# Patient Record
Sex: Female | Born: 1975 | Race: Black or African American | Hispanic: No | Smoking: Never smoker
Health system: Southern US, Community
[De-identification: ages and names within clinical notes are randomized; demographics above are authoritative.]

## PROBLEM LIST (undated history)

## (undated) ENCOUNTER — Inpatient Hospital Stay: Payer: Self-pay

## (undated) ENCOUNTER — Emergency Department: Admission: EM | Disposition: A | Discharge status: Still In Hospital | Payer: Medicare Other

## (undated) DIAGNOSIS — F329 Major depressive disorder, single episode, unspecified: Secondary | ICD-10-CM

## (undated) DIAGNOSIS — F32A Depression, unspecified: Secondary | ICD-10-CM

## (undated) DIAGNOSIS — K859 Acute pancreatitis without necrosis or infection, unspecified: Secondary | ICD-10-CM

## (undated) DIAGNOSIS — I10 Essential (primary) hypertension: Secondary | ICD-10-CM

## (undated) DIAGNOSIS — D649 Anemia, unspecified: Secondary | ICD-10-CM

## (undated) HISTORY — PX: CHOLECYSTECTOMY: SHX55

## (undated) NOTE — ED Notes (Signed)
 Formatting of this note might be different from the original. Awake and alert, oriented x4, reports needing refill of medication, no other c/o at this time, waiting registration for d/c Electronically signed by Brox, Kristi B, RN at 02/12/2021 12:05 AM CST

## (undated) NOTE — ED Provider Notes (Signed)
 Formatting of this note is different from the original.   Arch Pepper [phone]  [facility] EMERGENCY DEPARTMENT - [place]  [facility]  History   Chief Complaint  Patient presents with  ? Medication Request    Pt reuqesting metformin and albulterol inhaler; denies any physical c/o      This patient is a 17 year old  Female who presents to the ED for evaluation of needing a medication refill.  Patient states she needs her diabetic medications and an albuterol inhaler refilled.  Patient states she is currently traveling with her husband to [facility].  Patient's symptoms are constant, 0/10 severity, denies modifying factors, radiation of symptoms, associated symptoms other than as above.  No past medical history on file.  No past surgical history on file.  No family history on file.  Social History   Socioeconomic History  ? Marital status: Single    Spouse name: Not on file  ? Number of children: Not on file  ? Years of education: Not on file  ? Highest education level: Not on file  Occupational History  ? Not on file  Tobacco Use  ? Smoking status: Not on file  ? Smokeless tobacco: Not on file  Substance and Sexual Activity  ? Alcohol use: Not on file  ? Drug use: Not on file  ? Sexual activity: Not on file  Other Topics Concern  ? Not on file  Social History Narrative  ? Not on file   Social Determinants of Health   Financial Resource Strain: Not on file  Food Insecurity: Not on file  Transportation Needs: Not on file  Physical Activity: Not on file  Stress: Not on file  Social Connections: Not on file  Intimate Partner Violence: Not on file  Housing Stability: Not on file   Review of Systems   Review of Systems  Constitutional: Negative for chills and fever.  Eyes: Negative for blurred vision.  Respiratory: Negative for shortness of breath.   Cardiovascular: Negative for chest pain.  Gastrointestinal: Negative for abdominal pain.   Neurological: Negative for dizziness and headaches.   Physical Exam   BP 164/90   Pulse 81   Temp 97.8 F (36.6 C) (Oral)   Resp 16   Ht 1.778 m (5' 10)   Wt 108.9 kg (240 lb)   SpO2 100%   BMI 34.44 kg/m   Physical Exam Vitals and nursing note reviewed.  HENT:     Head: Normocephalic and atraumatic.     Nose: Nose normal.  Eyes:     General: No scleral icterus.    Conjunctiva/sclera: Conjunctivae normal.  Neck:     Trachea: No tracheal deviation.  Cardiovascular:     Rate and Rhythm: Normal rate and regular rhythm.  Pulmonary:     Effort: Pulmonary effort is normal.     Breath sounds: Normal breath sounds. No stridor.  Abdominal:     General: Bowel sounds are normal.     Palpations: Abdomen is soft.     Tenderness: There is no abdominal tenderness.  Musculoskeletal:        General: No deformity. Normal range of motion.     Cervical back: Normal range of motion.  Skin:    General: Skin is warm and dry.  Neurological:     Mental Status: She is alert.  Psychiatric:        Behavior: Behavior normal.   Medications Current Outpatient Medications  Medication Sig Dispense Refill  ? albuterol HFA (  PROAIR HFA) 108 (90 Base) MCG/ACT inhaler Inhale 2 (two) puffs by mouth every 6 hours as needed for Shortness of Breath or Wheezing 8.5 g 0  ? metFORMIN (GLUCOPHAGE) 500 MG tablet Take 2 (two) tablets by mouth 2 times daily with morning and evening meal for 30 days 120 tablet 0   Procedures  Procedures  Lab Interpretation      Oxygen Saturation Interpretation The oxygen saturation level is: 100%. The patient was on Room Air for the saturation measurement. Measurement frequency: Spot Check. Oxygen saturation interpretation is Normal. Intervention(s) used: None.  No results found for this visit on 02/11/21.  No orders to display   Progress Notes   ED Course  ED Course as of 02/12/21 0017  Wed Feb 12, 2021  0015 Pt S/E, HD stable, nontoxic, has normal exam and  requesting meds refilled. DC in stable condition with printed RX [MW]   ED Course User Index [MW] Debarah Donnice SAUNDERS, DO    Clinical Impressions as of 02/12/21 0017  Encounter for medication refill     Medical Decision Making I have reviewed the: Nursing Notes, Vitals. I have interpreted the following results: Oxygen Saturation.      Orders Placed This Encounter  ? metFORMIN (GLUCOPHAGE) 500 MG tablet  ? albuterol HFA (PROAIR HFA) 108 (90 Base) MCG/ACT inhaler    Follow-up Information    [facility] Emergency Department - [place]  [place].   Specialty: Emergency Room Why: As needed Contact information: [address] [place]  [place]  [phone]        Electronically signed by Debarah Donnice SAUNDERS, DO at 02/12/2021 12:17 AM CST

## (undated) NOTE — ED Notes (Signed)
 Formatting of this note might be different from the original. Provider at bs for exam Electronically signed by Bernetta Rayfield NOVAK, RN at 02/12/2021 12:04 AM CST

---

## 2015-11-29 ENCOUNTER — Encounter: Payer: Self-pay | Admitting: Emergency Medicine

## 2015-11-29 ENCOUNTER — Emergency Department
Admission: EM | Admit: 2015-11-29 | Discharge: 2015-11-29 | Disposition: A | Discharge status: Home / Self Care | Payer: Medicare Other | Attending: Emergency Medicine | Admitting: Emergency Medicine

## 2015-11-29 DIAGNOSIS — M62830 Muscle spasm of back: Secondary | ICD-10-CM | POA: Insufficient documentation

## 2015-11-29 DIAGNOSIS — I10 Essential (primary) hypertension: Secondary | ICD-10-CM | POA: Diagnosis not present

## 2015-11-29 DIAGNOSIS — M545 Low back pain: Secondary | ICD-10-CM | POA: Diagnosis present

## 2015-11-29 HISTORY — DX: Essential (primary) hypertension: I10

## 2015-11-29 HISTORY — DX: Acute pancreatitis without necrosis or infection, unspecified: K85.90

## 2015-11-29 MED ORDER — MELOXICAM 15 MG PO TABS: 15.0000 mg | ORAL_TABLET | Freq: Every day | ORAL | Status: DC

## 2015-11-29 MED ORDER — CYCLOBENZAPRINE HCL 10 MG PO TABS: 10.0000 mg | ORAL_TABLET | Freq: Three times a day (TID) | ORAL | Status: DC | PRN

## 2015-11-29 NOTE — ED Provider Notes (Signed)
[facility] Emergency Department Provider Note  ____________________________________________  Time seen: Approximately 4:58 PM  I have reviewed the triage vital signs and the nursing notes.   HISTORY  Chief Complaint Back Pain    HPI Jennifer Barton is a 39 y.o. female who presents emergency department complaining of bilateral lower back pain. Patient states that symptoms began this morning and have increased over the day. She denies any previous injury to area. She denies any numbness or tingling in lower extremity's. She states that the pain does increase with a "really deep breath." She denies any upper respiratory complaints to include his congestion, sore throat, cough, was. She denies any urinary complaints of dysuria, polyuria, hematuria. Pain is constant, worse with movement or deep breathing.   Past Medical History  Diagnosis Date  . Hypertension   . Pancreatitis     There are no active problems to display for this patient.   Past Surgical History  Procedure Laterality Date  . Cholecystectomy      Current Outpatient Rx  Name  Route  Sig  Dispense  Refill  . cyclobenzaprine (FLEXERIL) 10 MG tablet   Oral   Take 1 tablet (10 mg total) by mouth 3 (three) times daily as needed for muscle spasms.   15 tablet   0   . meloxicam (MOBIC) 15 MG tablet   Oral   Take 1 tablet (15 mg total) by mouth daily.   30 tablet   0     Allergies Review of patient's allergies indicates no known allergies.  No family history on file.  Social History Social History  Substance Use Topics  . Smoking status: Never Smoker   . Smokeless tobacco: None  . Alcohol Use: Yes     Comment: occasionally    Review of Systems Constitutional: No fever/chills Eyes: No visual changes. ENT: No sore throat. Cardiovascular: Denies chest pain. Respiratory: Denies shortness of breath. Gastrointestinal: No abdominal pain.  No nausea, no vomiting.  No diarrhea.  No  constipation. Genitourinary: Negative for dysuria. Musculoskeletal: Endorses lower back pain. Skin: Negative for rash. Neurological: Negative for headaches, focal weakness or numbness.  10-point ROS otherwise negative.  ____________________________________________   PHYSICAL EXAM:  VITAL SIGNS: ED Triage Vitals  Enc Vitals Group     BP 11/29/15 1630 159/92 mmHg     Pulse Rate 11/29/15 1630 85     Resp 11/29/15 1630 18     Temp 11/29/15 1630 97.7 F (36.5 C)     Temp Source 11/29/15 1630 Oral     SpO2 11/29/15 1630 98 %     Weight 11/29/15 1630 240 lb (108.863 kg)     Height 11/29/15 1630 5\' 10"  (1.778 m)     Head Cir --      Peak Flow --      Pain Score 11/29/15 1630 8     Pain Loc --      Pain Edu? --      Excl. in [place]? --     Constitutional: Alert and oriented. Well appearing and in no acute distress. Eyes: Conjunctivae are normal. PERRL. EOMI. Head: Atraumatic. Nose: No congestion/rhinnorhea. Mouth/Throat: Mucous membranes are moist.  Oropharynx non-erythematous. Neck: No stridor.  No cervical spine tenderness to palpation. Cardiovascular: Normal rate, regular rhythm. Grossly normal heart sounds.  Good peripheral circulation. Respiratory: Normal respiratory effort.  No retractions. Lungs CTAB. Gastrointestinal: Soft and nontender. No distention. No abdominal bruits. No CVA tenderness. Musculoskeletal: No lower extremity tenderness nor edema.  No joint effusions. No abnormality to inspection. Full range of motion of lower spine. Patient is tender to palpation over bilateral paraspinal muscles. Spasms are noted. Neurologic:  Normal speech and language. No gross focal neurologic deficits are appreciated. No gait instability. DTRs intact distally. Skin:  Skin is warm, dry and intact. No rash noted. Psychiatric: Mood and affect are normal. Speech and behavior are normal.  ____________________________________________   LABS (all labs ordered are listed, but only abnormal  results are displayed)  Labs Reviewed - No data to display ____________________________________________  EKG   ____________________________________________  RADIOLOGY   ____________________________________________   PROCEDURES  Procedure(s) performed: None  Critical Care performed: No  ____________________________________________   INITIAL IMPRESSION / ASSESSMENT AND PLAN / ED COURSE  Pertinent labs & imaging results that were available during my care of the patient were reviewed by me and considered in my medical decision making (see chart for details).  Patient's diagnosis is consistent with lumbar paraspinal muscle strain. Patient will be placed on Flexeril and meloxicam for symptom control. Patient is advised that she may also take Tylenol at home. Patient will follow up primary care for any symptoms persisting past this treatment course. Patient verbalizes understanding of diagnosis and treatment plan and verbalizes compliance with same.     New Prescriptions   CYCLOBENZAPRINE (FLEXERIL) 10 MG TABLET    Take 1 tablet (10 mg total) by mouth 3 (three) times daily as needed for muscle spasms.   MELOXICAM (MOBIC) 15 MG TABLET    Take 1 tablet (15 mg total) by mouth daily.    ____________________________________________   FINAL CLINICAL IMPRESSION(S) / ED DIAGNOSES  Final diagnoses:  Lumbar paraspinal muscle spasm      Darletta Moll, PA-C 11/29/15 [place], MD 12/05/15 2028

## 2015-11-29 NOTE — Discharge Instructions (Signed)
Back Exercises The following exercises strengthen the muscles that help to support the back. They also help to keep the lower back flexible. Doing these exercises can help to prevent back pain or lessen existing pain. If you have back pain or discomfort, try doing these exercises 2-3 times each day or as told by your health care provider. When the pain goes away, do them once each day, but increase the number of times that you repeat the steps for each exercise (do more repetitions). If you do not have back pain or discomfort, do these exercises once each day or as told by your health care provider. EXERCISES Single Knee to Chest Repeat these steps 3-5 times for each leg:  Lie on your back on a firm bed or the floor with your legs extended.  Bring one knee to your chest. Your other leg should stay extended and in contact with the floor.  Hold your knee in place by grabbing your knee or thigh.  Pull on your knee until you feel a gentle stretch in your lower back.  Hold the stretch for 10-30 seconds.  Slowly release and straighten your leg. Pelvic Tilt Repeat these steps 5-10 times:  Lie on your back on a firm bed or the floor with your legs extended.  Bend your knees so they are pointing toward the ceiling and your feet are flat on the floor.  Tighten your lower abdominal muscles to press your lower back against the floor. This motion will tilt your pelvis so your tailbone points up toward the ceiling instead of pointing to your feet or the floor.  With gentle tension and even breathing, hold this position for 5-10 seconds. Cat-Cow Repeat these steps until your lower back becomes more flexible:  Get into a hands-and-knees position on a firm surface. Keep your hands under your shoulders, and keep your knees under your hips. You may place padding under your knees for comfort.  Let your head hang down, and point your tailbone toward the floor so your lower back becomes rounded like the  back of a cat.  Hold this position for 5 seconds.  Slowly lift your head and point your tailbone up toward the ceiling so your back forms a sagging arch like the back of a cow.  Hold this position for 5 seconds. Press-Ups Repeat these steps 5-10 times:  Lie on your abdomen (face-down) on the floor.  Place your palms near your head, about shoulder-width apart.  While you keep your back as relaxed as possible and keep your hips on the floor, slowly straighten your arms to raise the top half of your body and lift your shoulders. Do not use your back muscles to raise your upper torso. You may adjust the placement of your hands to make yourself more comfortable.  Hold this position for 5 seconds while you keep your back relaxed.  Slowly return to lying flat on the floor. Bridges Repeat these steps 10 times:  Lie on your back on a firm surface.  Bend your knees so they are pointing toward the ceiling and your feet are flat on the floor.  Tighten your buttocks muscles and lift your buttocks off of the floor until your waist is at almost the same height as your knees. You should feel the muscles working in your buttocks and the back of your thighs. If you do not feel these muscles, slide your feet 1-2 inches farther away from your buttocks.  Hold this position for 3-5  seconds. °· Slowly lower your hips to the starting position, and allow your buttocks muscles to relax completely. °If this exercise is too easy, try doing it with your arms crossed over your chest. °Abdominal Crunches °Repeat these steps 5-10 times: °1. Lie on your back on a firm bed or the floor with your legs extended. °2. Bend your knees so they are pointing toward the ceiling and your feet are flat on the floor. °3. Cross your arms over your chest. °4. Tip your chin slightly toward your chest without bending your neck. °5. Tighten your abdominal muscles and slowly raise your trunk (torso) high enough to lift your shoulder blades  a tiny bit off of the floor. Avoid raising your torso higher than that, because it can put too much stress on your low back and it does not help to strengthen your abdominal muscles. °6. Slowly return to your starting position. °Back Lifts °Repeat these steps 5-10 times: °1. Lie on your abdomen (face-down) with your arms at your sides, and rest your forehead on the floor. °2. Tighten the muscles in your legs and your buttocks. °3. Slowly lift your chest off of the floor while you keep your hips pressed to the floor. Keep the back of your head in line with the curve in your back. Your eyes should be looking at the floor. °4. Hold this position for 3-5 seconds. °5. Slowly return to your starting position. °SEEK MEDICAL CARE IF: °· Your back pain or discomfort gets much worse when you do an exercise. °· Your back pain or discomfort does not lessen within 2 hours after you exercise. °If you have any of these problems, stop doing these exercises right away. Do not do them again unless your health care provider says that you can. °SEEK IMMEDIATE MEDICAL CARE IF: °· You develop sudden, severe back pain. If this happens, stop doing the exercises right away. Do not do them again unless your health care provider says that you can. °  °This information is not intended to replace advice given to you by your health care provider. Make sure you discuss any questions you have with your health care provider. °  °Document Released: 01/14/2005 Document Revised: 08/28/2015 Document Reviewed: 01/31/2015 °Elsevier Interactive Patient Education ©2016 [employer] ° °Heat Therapy °Heat therapy can help ease sore, stiff, injured, and tight muscles and joints. Heat relaxes your muscles, which may help ease your pain.  °RISKS AND COMPLICATIONS °If you have any of the following conditions, do not use heat therapy unless your health care provider has approved: °· Poor circulation. °· Healing wounds or scarred skin in the area being  treated. °· Diabetes, heart disease, or high blood pressure. °· Not being able to feel (numbness) the area being treated. °· Unusual swelling of the area being treated. °· Active infections. °· Blood clots. °· Cancer. °· Inability to communicate pain. This may include young children and people who have problems with their brain function (dementia). °· Pregnancy. °Heat therapy should only be used on old, pre-existing, or long-lasting (chronic) injuries. Do not use heat therapy on new injuries unless directed by your health care provider. °HOW TO USE HEAT THERAPY °There are several different kinds of heat therapy, including: °· Moist heat pack. °· Warm water bath. °· Hot water bottle. °· Electric heating pad. °· Heated gel pack. °· Heated wrap. °· Electric heating pad. °Use the heat therapy method suggested by your health care provider. Follow your health care provider's instructions on when   and how to use heat therapy. °GENERAL HEAT THERAPY RECOMMENDATIONS °· Do not sleep while using heat therapy. Only use heat therapy while you are awake. °· Your skin may turn pink while using heat therapy. Do not use heat therapy if your skin turns red. °· Do not use heat therapy if you have new pain. °· High heat or long exposure to heat can cause burns. Be careful when using heat therapy to avoid burning your skin. °· Do not use heat therapy on areas of your skin that are already irritated, such as with a rash or sunburn. °SEEK MEDICAL CARE IF: °· You have blisters, redness, swelling, or numbness. °· You have new pain. °· Your pain is worse. °MAKE SURE YOU: °· Understand these instructions. °· Will watch your condition. °· Will get help right away if you are not doing well or get worse. °  °This information is not intended to replace advice given to you by your health care provider. Make sure you discuss any questions you have with your health care provider. °  °Document Released: 02/29/2012 Document Revised: 12/28/2014 Document  Reviewed: 01/30/2014 °Elsevier Interactive Patient Education ©2016 [employer] Inc. ° °Muscle Cramps and Spasms °Muscle cramps and spasms occur when a muscle or muscles tighten and you have no control over this tightening (involuntary muscle contraction). They are a common problem and can develop in any muscle. The most common place is in the calf muscles of the leg. Both muscle cramps and muscle spasms are involuntary muscle contractions, but they also have differences:  °· Muscle cramps are sporadic and painful. They may last a few seconds to a quarter of an hour. Muscle cramps are often more forceful and last longer than muscle spasms. °· Muscle spasms may or may not be painful. They may also last just a few seconds or much longer. °CAUSES  °It is uncommon for cramps or spasms to be due to a serious underlying problem. In many cases, the cause of cramps or spasms is unknown. Some common causes are:  °· Overexertion.   °· Overuse from repetitive motions (doing the same thing over and over).   °· Remaining in a certain position for a long period of time.   °· Improper preparation, form, or technique while performing a sport or activity.   °· Dehydration.   °· Injury.   °· Side effects of some medicines.   °· Abnormally low levels of the salts and ions in your blood (electrolytes), especially potassium and calcium. This could happen if you are taking water pills (diuretics) or you are pregnant.   °Some underlying medical problems can make it more likely to develop cramps or spasms. These include, but are not limited to:  °· Diabetes.   °· Parkinson disease.   °· Hormone disorders, such as thyroid problems.   °· Alcohol abuse.   °· Diseases specific to muscles, joints, and bones.   °· Blood vessel disease where not enough blood is getting to the muscles.   °HOME CARE INSTRUCTIONS  °· Stay well hydrated. Drink enough water and fluids to keep your urine clear or pale yellow. °· It may be helpful to massage, stretch, and  relax the affected muscle. °· For tight or tense muscles, use a warm towel, heating pad, or hot shower water directed to the affected area. °· If you are sore or have pain after a cramp or spasm, applying ice to the affected area may relieve discomfort. °¨ Put ice in a plastic bag. °¨ Place a towel between your skin and the bag. °¨ Leave the ice   on for 15-20 minutes, 03-04 times a day. °· Medicines used to treat a known cause of cramps or spasms may help reduce their frequency or severity. Only take over-the-counter or prescription medicines as directed by your caregiver. °SEEK MEDICAL CARE IF:  °Your cramps or spasms get more severe, more frequent, or do not improve over time.  °MAKE SURE YOU:  °· Understand these instructions. °· Will watch your condition. °· Will get help right away if you are not doing well or get worse. °  °This information is not intended to replace advice given to you by your health care provider. Make sure you discuss any questions you have with your health care provider. °  °Document Released: 05/29/2002 Document Revised: 04/03/2013 Document Reviewed: 11/23/2012 °Elsevier Interactive Patient Education ©2016 [employer] Inc[employer] ° °

## 2015-11-29 NOTE — ED Notes (Signed)
Patient presents to the ED with lower back pain since 3am.  Patient states pain is worse with deep breaths.  Patient denies dysuria and frequency.  Denies trauma and heavy lifting.  Patient ambulatory to triage without difficulty.

## 2016-01-06 ENCOUNTER — Emergency Department
Admission: EM | Admit: 2016-01-06 | Discharge: 2016-01-07 | Disposition: A | Discharge status: Other Acute Inpatient Hospital | Payer: Medicare Other | Attending: Emergency Medicine | Admitting: Emergency Medicine

## 2016-01-06 DIAGNOSIS — W228XXA Striking against or struck by other objects, initial encounter: Secondary | ICD-10-CM | POA: Diagnosis not present

## 2016-01-06 DIAGNOSIS — F329 Major depressive disorder, single episode, unspecified: Secondary | ICD-10-CM | POA: Diagnosis not present

## 2016-01-06 DIAGNOSIS — E860 Dehydration: Secondary | ICD-10-CM

## 2016-01-06 DIAGNOSIS — S0083XA Contusion of other part of head, initial encounter: Secondary | ICD-10-CM | POA: Diagnosis not present

## 2016-01-06 DIAGNOSIS — F32A Depression, unspecified: Secondary | ICD-10-CM

## 2016-01-06 DIAGNOSIS — Y9389 Activity, other specified: Secondary | ICD-10-CM | POA: Diagnosis not present

## 2016-01-06 DIAGNOSIS — F4325 Adjustment disorder with mixed disturbance of emotions and conduct: Secondary | ICD-10-CM

## 2016-01-06 DIAGNOSIS — I10 Essential (primary) hypertension: Secondary | ICD-10-CM | POA: Diagnosis not present

## 2016-01-06 DIAGNOSIS — Z7289 Other problems related to lifestyle: Secondary | ICD-10-CM

## 2016-01-06 DIAGNOSIS — Z791 Long term (current) use of non-steroidal anti-inflammatories (NSAID): Secondary | ICD-10-CM | POA: Insufficient documentation

## 2016-01-06 DIAGNOSIS — Y998 Other external cause status: Secondary | ICD-10-CM | POA: Diagnosis not present

## 2016-01-06 DIAGNOSIS — Z3202 Encounter for pregnancy test, result negative: Secondary | ICD-10-CM | POA: Insufficient documentation

## 2016-01-06 DIAGNOSIS — F99 Mental disorder, not otherwise specified: Secondary | ICD-10-CM | POA: Diagnosis present

## 2016-01-06 DIAGNOSIS — R7309 Other abnormal glucose: Secondary | ICD-10-CM

## 2016-01-06 DIAGNOSIS — Y9289 Other specified places as the place of occurrence of the external cause: Secondary | ICD-10-CM | POA: Diagnosis not present

## 2016-01-06 DIAGNOSIS — IMO0002 Reserved for concepts with insufficient information to code with codable children: Secondary | ICD-10-CM

## 2016-01-06 DIAGNOSIS — T148XXA Other injury of unspecified body region, initial encounter: Secondary | ICD-10-CM

## 2016-01-06 HISTORY — DX: Depression, unspecified: F32.A

## 2016-01-06 HISTORY — DX: Major depressive disorder, single episode, unspecified: F32.9

## 2016-01-06 LAB — CBC
HEMATOCRIT: 41.2 % (ref 35.0–47.0)
HEMOGLOBIN: 14.2 g/dL (ref 12.0–16.0)
MCH: 30.7 pg (ref 26.0–34.0)
MCHC: 34.4 g/dL (ref 32.0–36.0)
MCV: 89.2 fL (ref 80.0–100.0)
PLATELETS: 294 10*3/uL (ref 150–440)
RBC: 4.62 MIL/uL (ref 3.80–5.20)
RDW: 13.4 % (ref 11.5–14.5)
WBC: 10.8 10*3/uL (ref 3.6–11.0)

## 2016-01-06 LAB — BASIC METABOLIC PANEL
ANION GAP: 11 (ref 5–15)
BUN: 13 mg/dL (ref 6–20)
CHLORIDE: 101 mmol/L (ref 101–111)
CO2: 25 mmol/L (ref 22–32)
Calcium: 9.6 mg/dL (ref 8.9–10.3)
Creatinine, Ser: 0.57 mg/dL (ref 0.44–1.00)
GFR calc Af Amer: >60 mL/min (ref >60)
GFR calc non Af Amer: >60 mL/min (ref >60)
GLUCOSE: 264 mg/dL — AB (ref 65–99)
POTASSIUM: 3.5 mmol/L (ref 3.5–5.1)
Sodium: 137 mmol/L (ref 135–145)

## 2016-01-06 LAB — ETHANOL: Alcohol, Ethyl (B): <5 mg/dL (ref <5)

## 2016-01-06 LAB — POCT PREGNANCY, URINE: Preg Test, Ur: NEGATIVE
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
## 2016-01-06 NOTE — ED Notes (Signed)
Pt changed into scrubs and belongings put in secure locker area

## 2016-01-07 ENCOUNTER — Encounter: Payer: Self-pay | Admitting: Emergency Medicine

## 2016-01-07 DIAGNOSIS — F4325 Adjustment disorder with mixed disturbance of emotions and conduct: Secondary | ICD-10-CM

## 2016-01-07 DIAGNOSIS — IMO0002 Reserved for concepts with insufficient information to code with codable children: Secondary | ICD-10-CM

## 2016-01-07 DIAGNOSIS — R7309 Other abnormal glucose: Secondary | ICD-10-CM

## 2016-01-07 DIAGNOSIS — Z7289 Other problems related to lifestyle: Secondary | ICD-10-CM

## 2016-01-07 LAB — URINALYSIS COMPLETE WITH MICROSCOPIC (ARMC ONLY)
BILIRUBIN URINE: NEGATIVE
Bacteria, UA: NONE SEEN
Glucose, UA: 50 mg/dL — AB
Leukocytes, UA: NEGATIVE
NITRITE: NEGATIVE
Protein, ur: 30 mg/dL — AB
Specific Gravity, Urine: 1.027 (ref 1.005–1.030)
pH: 5 (ref 5.0–8.0)

## 2016-01-07 LAB — URINE DRUG SCREEN, QUALITATIVE (ARMC ONLY)
AMPHETAMINES, UR SCREEN: NOT DETECTED
Barbiturates, Ur Screen: NOT DETECTED
Benzodiazepine, Ur Scrn: NOT DETECTED
Cannabinoid 50 Ng, Ur ~~LOC~~: NOT DETECTED
Cocaine Metabolite,Ur ~~LOC~~: NOT DETECTED
MDMA (ECSTASY) UR SCREEN: NOT DETECTED
Methadone Scn, Ur: NOT DETECTED
OPIATE, UR SCREEN: NOT DETECTED
PHENCYCLIDINE (PCP) UR S: NOT DETECTED
Tricyclic, Ur Screen: NOT DETECTED

## 2016-01-07 MED ORDER — ACETAMINOPHEN 325 MG PO TABS
650.0000 mg | ORAL_TABLET | Freq: Once | ORAL | Status: AC
  Administered 2016-01-07: 650 mg via ORAL
  Filled 2016-01-07: qty 2

## 2016-01-07 NOTE — ED Notes (Signed)
Report received from Christine RN

## 2016-01-07 NOTE — ED Notes (Signed)
Lunch provided.

## 2016-01-07 NOTE — ED Notes (Signed)
Pt ambulatory from quad to BHU w/o difficulty, accompanied by ED tech and officer.  Pt denies any current needs at this time.  Patient assigned to appropriate care area. Patient oriented to unit/care area: Informed that, for their safety, care areas are designed for safety and monitored by security cameras at all times; and visiting hours explained to patient. Patient verbalizes understanding, and verbal contract for safety obtained.  ED BHU [place] Is the patient under IVC or is there intent for IVC: Yes.   Is the patient medically cleared: Yes.   Is there vacancy in the ED BHU: Yes.   Is the population mix appropriate for patient: Yes.   Is the patient awaiting placement in inpatient or outpatient setting: No. Has the patient had a psychiatric consult: No. Survey of unit performed for contraband, proper placement and condition of furniture, tampering with fixtures in bathroom, shower, and each patient room: Yes.  ; Findings:  APPEARANCE/BEHAVIOR calm and cooperative NEURO ASSESSMENT Orientation: time, place and person Hallucinations: No.None noted (Hallucinations) Speech: Normal Gait: normal RESPIRATORY ASSESSMENT Normal expansion.  Clear to auscultation.  No rales, rhonchi, or wheezing. CARDIOVASCULAR ASSESSMENT regular rate and rhythm, S1, S2 normal, no murmur, click, rub or gallop GASTROINTESTINAL ASSESSMENT soft, nontender, BS WNL, no r/g EXTREMITIES normal strength, tone, and muscle mass PLAN OF CARE Provide calm/safe environment. Vital signs assessed twice daily. ED BHU Assessment once each 12-hour shift. Collaborate with intake RN daily or as condition indicates. Assure the ED provider has rounded once each shift. Provide and encourage hygiene. Provide redirection as needed. Assess for escalating behavior; address immediately and inform ED provider.  Assess family dynamic and appropriateness for visitation as needed: No.; If necessary, describe findings: family  unavailable Educate the patient/family about BHU procedures/visitation: Yes.  ; If necessary, describe findings:
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
## 2016-01-07 NOTE — ED Notes (Signed)
Patient expresses readiness for discharge.  Discharge instructions are reviewed with the patient and patient voices understanding.  Belongings are returned to patient upon leaving the unit.  Patient denies suicidal ideation, homicidal ideation, auditory or visual hallucinations currently.  Patient expresses having a very mild headache.  Patient remains safe at time of discharge.

## 2016-01-07 NOTE — ED Notes (Signed)
Patient states she was brought in because she was at her "breaking point". Patient states her and her boyfriend broke up a couple days ago after leasing an apartment together. Patient states she has been in the area for about 2 months, moving here to be with him from [place]. Patient states her boyfriend decided to go back with his wife today. Patient denies SI or HI at this time. Patient states, "I know I needed to pull myself away from him. I don't have a car or anything so I have to keep in contact with him."

## 2016-01-07 NOTE — ED Notes (Signed)
Report called to [employer], pt moved to [facility] 5 per MD order.

## 2016-01-07 NOTE — ED Notes (Signed)
Report received from Raquel, RN

## 2016-01-07 NOTE — ED Notes (Signed)
Breakfast tray provided. 

## 2016-01-07 NOTE — ED Provider Notes (Signed)
-----------------------------------------   11:36 AM on 01/07/2016 -----------------------------------------   Blood pressure 137/84, pulse 88, temperature 98 F (36.7 C), temperature source Oral, resp. rate 16, height 5\' 9"  (1.753 m), weight 250 lb (113.399 kg), last menstrual period 01/05/2016, SpO2 98 %.  The patient had no acute events since last update.  Calm and cooperative at this time. Patient without any suicidal or homicidal ideation. Cleared by Dr. Weber Cooks for discharge and follow-up with [facility].    Orbie Pyo, MD 01/07/16 1136
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
## 2016-01-07 NOTE — ED Provider Notes (Signed)
[facility] Emergency Department Provider Note  ____________________________________________  Time seen: Approximately 12:29 AM  I have reviewed the triage vital signs and the nursing notes.   HISTORY  Chief Complaint Mental Health Problem    HPI Jennifer Barton is a 40 y.o. female who presents to the ED under IVC for hitting herself in the head. Patient is upset because her boyfriend decided to go back to his wife. Apparently she struck herself in the forehead with her cell phone. Denies LOC. Denies associated neck pain, vision changes, chest pain, shortness of breath, abdominal pain, dizziness, nausea, vomiting, diarrhea. Does complain of a mild headache. Denies active SI/HI/AH/VH.   Past Medical History  Diagnosis Date  . Hypertension   . Pancreatitis     There are no active problems to display for this patient.   Past Surgical History  Procedure Laterality Date  . Cholecystectomy      Current Outpatient Rx  Name  Route  Sig  Dispense  Refill  . cyclobenzaprine (FLEXERIL) 10 MG tablet   Oral   Take 1 tablet (10 mg total) by mouth 3 (three) times daily as needed for muscle spasms.   15 tablet   0   . meloxicam (MOBIC) 15 MG tablet   Oral   Take 1 tablet (15 mg total) by mouth daily.   30 tablet   0     Allergies Review of patient's allergies indicates no known allergies.  No family history on file.  Social History Social History  Substance Use Topics  . Smoking status: Never Smoker   . Smokeless tobacco: Not on file  . Alcohol Use: Yes     Comment: occasionally    Review of Systems Constitutional: No fever/chills Eyes: No visual changes. ENT: No sore throat. Cardiovascular: Denies chest pain. Respiratory: Denies shortness of breath. Gastrointestinal: No abdominal pain.  No nausea, no vomiting.  No diarrhea.  No constipation. Genitourinary: Negative for dysuria. Musculoskeletal: Negative for back pain. Skin: Negative for  rash. Neurological: Positive for headache. Negative for focal weakness or numbness. Psychiatric:Positive for self-injurious behavior.  10-point ROS otherwise negative.  ____________________________________________   PHYSICAL EXAM:  VITAL SIGNS: ED Triage Vitals  Enc Vitals Group     BP 01/06/16 2307 165/140 mmHg     Pulse Rate 01/06/16 2307 115     Resp 01/06/16 2307 18     Temp 01/06/16 2307 98 F (36.7 C)     Temp Source 01/06/16 2307 Oral     SpO2 01/06/16 2307 98 %     Weight 01/06/16 2307 250 lb (113.399 kg)     Height 01/06/16 2307 5\' 9"  (1.753 m)     Head Cir --      Peak Flow --      Pain Score --      Pain Loc --      Pain Edu? --      Excl. in [place]? --     Constitutional: Alert and oriented. Well appearing and in no acute distress. Tearful and upset. Eyes: Conjunctivae are normal. PERRL. EOMI. Head: Small forehead hematoma. No lacerations, no active bleeding. Nose: No congestion/rhinnorhea. Mouth/Throat: Mucous membranes are moist.  Oropharynx non-erythematous. Neck: No stridor.  No cervical spine tenderness to palpation. Cardiovascular: Normal rate, regular rhythm. Grossly normal heart sounds.  Good peripheral circulation. Respiratory: Normal respiratory effort.  No retractions. Lungs CTAB. Gastrointestinal: Soft and nontender. No distention. No abdominal bruits. No CVA tenderness. Musculoskeletal: No lower extremity tenderness nor edema.  No joint effusions. Neurologic:  Normal speech and language. No gross focal neurologic deficits are appreciated. No gait instability. Skin:  Skin is warm, dry and intact. No rash noted. Psychiatric: Mood and affect are flat. Speech and behavior are normal.  ____________________________________________   LABS (all labs ordered are listed, but only abnormal results are displayed)  Labs Reviewed  BASIC METABOLIC PANEL - Abnormal; Notable for the following:    Glucose, Bld 264 (*)    All other components within normal  limits  URINALYSIS COMPLETEWITH MICROSCOPIC ([facility] ONLY) - Abnormal; Notable for the following:    Color, Urine YELLOW (*)    APPearance CLEAR (*)    Glucose, UA 50 (*)    Ketones, ur 2+ (*)    Hgb urine dipstick 2+ (*)    Protein, ur 30 (*)    Squamous Epithelial / LPF 0-5 (*)    All other components within normal limits  CBC  ETHANOL  URINE DRUG SCREEN, QUALITATIVE ([facility] ONLY)  POC URINE PREG, ED  POCT PREGNANCY, URINE   ____________________________________________  EKG  None ____________________________________________  RADIOLOGY  None ____________________________________________   PROCEDURES  Procedure(s) performed: None  Critical Care performed: No  ____________________________________________   INITIAL IMPRESSION / ASSESSMENT AND PLAN / ED COURSE  Pertinent labs & imaging results that were available during my care of the patient were reviewed by me and considered in my medical decision making (see chart for details).  40 year old female who presents under IVC for self-injurious behavior. Screening lab work and urinalysis notable for mild dehydration given ketones in her urine. Will encouraged patient to hydrate orally. At this time she is medically cleared for TTS and psychiatry consults. ____________________________________________   FINAL CLINICAL IMPRESSION(S) / ED DIAGNOSES  Final diagnoses:  Depression  Hematoma  Dehydration      Paulette Blanch, MD 01/07/16 [phone]

## 2016-02-28 ENCOUNTER — Emergency Department
Admission: EM | Admit: 2016-02-28 | Discharge: 2016-02-28 | Disposition: A | Discharge status: Home / Self Care | Payer: Medicare Other | Attending: Emergency Medicine | Admitting: Emergency Medicine

## 2016-02-28 ENCOUNTER — Encounter: Payer: Self-pay | Admitting: Emergency Medicine

## 2016-02-28 DIAGNOSIS — K645 Perianal venous thrombosis: Secondary | ICD-10-CM | POA: Diagnosis not present

## 2016-02-28 DIAGNOSIS — I10 Essential (primary) hypertension: Secondary | ICD-10-CM | POA: Diagnosis not present

## 2016-02-28 DIAGNOSIS — K6289 Other specified diseases of anus and rectum: Secondary | ICD-10-CM | POA: Diagnosis present

## 2016-02-28 MED ORDER — LIDOCAINE-EPINEPHRINE (PF) 1 %-1:200000 IJ SOLN
10.0000 mL | Freq: Once | INTRAMUSCULAR | Status: AC
  Administered 2016-02-28: 10 mL via INTRADERMAL

## 2016-02-28 MED ORDER — OXYCODONE-ACETAMINOPHEN 5-325 MG PO TABS
2.0000 | ORAL_TABLET | Freq: Once | ORAL | Status: AC
  Administered 2016-02-28: 2 via ORAL
  Filled 2016-02-28: qty 2

## 2016-02-28 MED ORDER — LIDOCAINE-EPINEPHRINE (PF) 1 %-1:200000 IJ SOLN
INTRAMUSCULAR | Status: AC
  Administered 2016-02-28: 10 mL via INTRADERMAL
  Filled 2016-02-28: qty 30

## 2016-02-28 MED ORDER — HYDROCORTISONE 2.5 % RE CREA: 1.0000 "application " | TOPICAL_CREAM | Freq: Two times a day (BID) | RECTAL | Status: DC

## 2016-02-28 MED ORDER — OXYCODONE-ACETAMINOPHEN 5-325 MG PO TABS: 2.0000 | ORAL_TABLET | Freq: Four times a day (QID) | ORAL | Status: DC | PRN

## 2016-02-28 MED ORDER — LIDOCAINE-EPINEPHRINE 1 %-1:100000 IJ SOLN
10.0000 mL | Freq: Once | INTRAMUSCULAR | Status: DC
  Filled 2016-02-28: qty 10

## 2016-02-28 MED ORDER — LIDOCAINE VISCOUS 2 % MT SOLN: 20.0000 mL | OROMUCOSAL | Status: DC | PRN

## 2016-02-28 MED ORDER — LIDOCAINE VISCOUS 2 % MT SOLN
15.0000 mL | Freq: Once | OROMUCOSAL | Status: AC
  Administered 2016-02-28: 15 mL via OROMUCOSAL
  Filled 2016-02-28: qty 15

## 2016-02-28 MED ORDER — DOCUSATE SODIUM 100 MG PO CAPS: 100.0000 mg | ORAL_CAPSULE | Freq: Every day | ORAL | Status: DC | PRN

## 2016-02-28 NOTE — Discharge Instructions (Signed)

## 2016-02-28 NOTE — ED Notes (Signed)
C/o "bump to rectum".  Denies bleeding.  C/O pain x 1 week.  Patient states she strains when moving bowels.

## 2016-02-28 NOTE — ED Provider Notes (Signed)
[facility] Emergency Department Provider Note     Time seen: ----------------------------------------- 2:22 PM on 02/28/2016 -----------------------------------------    I have reviewed the triage vital signs and the nursing notes.   HISTORY  Chief Complaint Rectal Pain    HPI Jennifer Barton is a 40 y.o. female who presents the ER for for pain in her rectum. Patient states she feels a small bump in her rectal area that she has not had before. She's been having pain there for the last week, nothing makes it better, using the bathroom makes it worse. She has been somewhat constipated, has taken several along trips in the car and has been sitting for long periods of time. She is unsure if she has hemorrhoids or not.   Past Medical History  Diagnosis Date  . Hypertension   . Pancreatitis   . Depression     Patient Active Problem List   Diagnosis Date Noted  . 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 Self-inflicted injury [id number]  . Elevated glucose 01/07/2016    Past Surgical History  Procedure Laterality Date  . Cholecystectomy      Allergies Review of patient's allergies indicates no known allergies.  Social History Social History  Substance Use Topics  . Smoking status: Never Smoker   . Smokeless tobacco: None  . Alcohol Use: Yes     Comment: occasionally    Review of Systems Constitutional: Negative for fever. Gastrointestinal: Negative for abdominal pain, vomiting and diarrhea.Positive for rectal pain Genitourinary: Negative for dysuria. Musculoskeletal: Negative for back pain. Skin: Negative for rash. ____________________________________________   PHYSICAL EXAM:  VITAL SIGNS: ED Triage Vitals  Enc Vitals Group     BP 02/28/16 1250 134/91 mmHg     Pulse Rate 02/28/16 1250 81     Resp 02/28/16 1250 16     Temp 02/28/16 1250 97.4 F (36.3 C)     Temp Source 02/28/16 1250 Oral    SpO2 02/28/16 1250 99 %     Weight 02/28/16 1250 240 lb (108.863 kg)     Height 02/28/16 1250 5\' 10"  (1.778 m)     Head Cir --      Peak Flow --      Pain Score 02/28/16 1251 6     Pain Loc --      Pain Edu? --      Excl. in [place]? --     Constitutional: Alert and oriented. Well appearing and in no distress. Rectal: There is a small hard thrombosed external hemorrhoid at 1:00 with the patient in prone position, soft external hemorrhoid at 6:00. Musculoskeletal: Nontender with normal range of motion in all extremities. No joint effusions.  No lower extremity tenderness nor edema. Skin:  Skin is warm, dry and intact. No rash noted. ____________________________________________  ED COURSE:  Pertinent labs & imaging results that were available during my care of the patient were reviewed by me and considered in my medical decision making (see chart for details). Patient with a thrombosed hemorrhoid which will require incision and clot evacuation. Otherwise she is in no acute distress. INCISION AND DRAINAGE Performed by: Lenise Arena E Consent: Verbal consent obtained. Risks and benefits: risks, benefits and alternatives were discussed Type: abscess  Body area: Perianal area  Anesthesia: local infiltration  Incision was made with a scalpel.  Local anesthetic: lidocaine 1 % with epinephrine  Anesthetic total: 2 ml  Complexity: Simple thrombosed hemorrhoid   Drainage: Clot  Packing material: 1/4 in iodoform gauze  Patient tolerance: Patient tolerated the procedure well with no immediate complications.     ____________________________________________  FINAL ASSESSMENT AND PLAN  Thrombosed hemorrhoid  Plan: Patient with labs and imaging as dictated above. Patient be discharged with Anusol cream, pain medicine and Colace. She stable for outpatient follow-up with gastroenterology.   Earleen Newport, MD   Earleen Newport, MD 02/28/16 [phone]

## 2016-05-19 ENCOUNTER — Other Ambulatory Visit: Payer: Self-pay | Admitting: Primary Care

## 2016-05-19 DIAGNOSIS — Z3491 Encounter for supervision of normal pregnancy, unspecified, first trimester: Secondary | ICD-10-CM

## 2016-05-22 ENCOUNTER — Ambulatory Visit
Admission: RE | Admit: 2016-05-22 | Discharge: 2016-05-22 | Disposition: A | Discharge status: Home / Self Care | Payer: Medicare Other | Source: Ambulatory Visit | Attending: Primary Care | Admitting: Primary Care

## 2016-05-22 DIAGNOSIS — Z3A12 12 weeks gestation of pregnancy: Secondary | ICD-10-CM | POA: Insufficient documentation

## 2016-05-22 DIAGNOSIS — Z3491 Encounter for supervision of normal pregnancy, unspecified, first trimester: Secondary | ICD-10-CM

## 2016-05-22 DIAGNOSIS — D259 Leiomyoma of uterus, unspecified: Secondary | ICD-10-CM | POA: Insufficient documentation

## 2016-05-22 DIAGNOSIS — Z331 Pregnant state, incidental: Secondary | ICD-10-CM | POA: Insufficient documentation

## 2016-06-10 ENCOUNTER — Encounter: Payer: Self-pay | Admitting: Emergency Medicine

## 2016-06-10 ENCOUNTER — Emergency Department
Admission: EM | Admit: 2016-06-10 | Discharge: 2016-06-10 | Disposition: A | Discharge status: Home / Self Care | Payer: Medicare Other | Attending: Emergency Medicine | Admitting: Emergency Medicine

## 2016-06-10 ENCOUNTER — Emergency Department: Payer: Medicare Other

## 2016-06-10 DIAGNOSIS — F329 Major depressive disorder, single episode, unspecified: Secondary | ICD-10-CM | POA: Insufficient documentation

## 2016-06-10 DIAGNOSIS — Z3A15 15 weeks gestation of pregnancy: Secondary | ICD-10-CM | POA: Insufficient documentation

## 2016-06-10 DIAGNOSIS — Z79899 Other long term (current) drug therapy: Secondary | ICD-10-CM | POA: Diagnosis not present

## 2016-06-10 DIAGNOSIS — F4325 Adjustment disorder with mixed disturbance of emotions and conduct: Secondary | ICD-10-CM | POA: Diagnosis not present

## 2016-06-10 DIAGNOSIS — R109 Unspecified abdominal pain: Secondary | ICD-10-CM

## 2016-06-10 DIAGNOSIS — O26899 Other specified pregnancy related conditions, unspecified trimester: Secondary | ICD-10-CM

## 2016-06-10 DIAGNOSIS — I10 Essential (primary) hypertension: Secondary | ICD-10-CM | POA: Diagnosis not present

## 2016-06-10 DIAGNOSIS — Z3492 Encounter for supervision of normal pregnancy, unspecified, second trimester: Secondary | ICD-10-CM

## 2016-06-10 DIAGNOSIS — O26892 Other specified pregnancy related conditions, second trimester: Secondary | ICD-10-CM | POA: Insufficient documentation

## 2016-06-10 DIAGNOSIS — R1032 Left lower quadrant pain: Secondary | ICD-10-CM | POA: Diagnosis present

## 2016-06-10 LAB — URINALYSIS COMPLETE WITH MICROSCOPIC (ARMC ONLY)
BILIRUBIN URINE: NEGATIVE
GLUCOSE, UA: NEGATIVE mg/dL
HGB URINE DIPSTICK: NEGATIVE
LEUKOCYTES UA: NEGATIVE
NITRITE: NEGATIVE
Protein, ur: NEGATIVE mg/dL
SPECIFIC GRAVITY, URINE: 1.025 (ref 1.005–1.030)
pH: 5 (ref 5.0–8.0)

## 2016-06-10 LAB — COMPREHENSIVE METABOLIC PANEL
ALBUMIN: 4 g/dL (ref 3.5–5.0)
ALT: 19 U/L (ref 14–54)
ANION GAP: 10 (ref 5–15)
AST: 19 U/L (ref 15–41)
Alkaline Phosphatase: 41 U/L (ref 38–126)
BILIRUBIN TOTAL: 0.3 mg/dL (ref 0.3–1.2)
BUN: 8 mg/dL (ref 6–20)
CO2: 22 mmol/L (ref 22–32)
Calcium: 9.2 mg/dL (ref 8.9–10.3)
Chloride: 102 mmol/L (ref 101–111)
Creatinine, Ser: 0.48 mg/dL (ref 0.44–1.00)
GFR calc non Af Amer: >60 mL/min (ref >60)
GLUCOSE: 139 mg/dL — AB (ref 65–99)
POTASSIUM: 3.6 mmol/L (ref 3.5–5.1)
SODIUM: 134 mmol/L — AB (ref 135–145)
TOTAL PROTEIN: 7.6 g/dL (ref 6.5–8.1)

## 2016-06-10 LAB — CBC
HEMATOCRIT: 33.5 % — AB (ref 35.0–47.0)
Hemoglobin: 12.2 g/dL (ref 12.0–16.0)
MCH: 31.2 pg (ref 26.0–34.0)
MCHC: 36.3 g/dL — AB (ref 32.0–36.0)
MCV: 85.9 fL (ref 80.0–100.0)
Platelets: 239 10*3/uL (ref 150–440)
RBC: 3.89 MIL/uL (ref 3.80–5.20)
RDW: 13.2 % (ref 11.5–14.5)
WBC: 8.1 10*3/uL (ref 3.6–11.0)

## 2016-06-10 LAB — HCG, QUANTITATIVE, PREGNANCY: hCG, Beta Chain, Quant, S: 52859 m[IU]/mL — ABNORMAL HIGH (ref <5)

## 2016-06-10 MED ORDER — ACETAMINOPHEN 500 MG PO TABS
1000.0000 mg | ORAL_TABLET | Freq: Once | ORAL | Status: AC
  Administered 2016-06-10: 1000 mg via ORAL
  Filled 2016-06-10: qty 2

## 2016-06-10 NOTE — ED Notes (Signed)
Pt to ed with c/o pelvic pain x 1 day, denies vaginal bleeding.  Pt states she is [redacted] weeks pregnant.  Pt has not had prenatal care yet.

## 2016-06-10 NOTE — ED Notes (Signed)
Pt a/o, vss. Mid low abd pain 8/10. Denies vaginal bleeding or discharge. Denies changes in urination.

## 2016-06-10 NOTE — ED Provider Notes (Signed)
[facility] Emergency Department Provider Note  Time seen: 9:50 AM  I have reviewed the triage vital signs and the nursing notes.   HISTORY  Chief Complaint Pelvic Pain    HPI Jennifer Barton is a 40 y.o. female G3 P1 A1 approximately [redacted] weeks pregnant who presents to the emergency department with left lower quadrant abdominal pain and cramping. Patient states the pain is moderate 8 out of 10 pain which she describes as a cramping/dull sensation. Denies any diarrhea or dysuria. States intermittent nausea and vomiting which has been present throughout her pregnancy. Does not have her first OB appointment until 2 weeks from today. Patient has a past medical history including hypertension depression, with a history of pancreatitis in the past. Denies any known modifying factors. Denies any vaginal bleeding or discharge.     Past Medical History  Diagnosis Date  . Hypertension   . Pancreatitis   . Depression     Patient Active Problem List   Diagnosis Date Noted  . 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 Self-inflicted injury [id number]  . Elevated glucose 01/07/2016    Past Surgical History  Procedure Laterality Date  . Cholecystectomy      Current Outpatient Rx  Name  Route  Sig  Dispense  Refill  . cyclobenzaprine (FLEXERIL) 10 MG tablet   Oral   Take 1 tablet (10 mg total) by mouth 3 (three) times daily as needed for muscle spasms.   15 tablet   0   . docusate sodium (COLACE) 100 MG capsule   Oral   Take 1 capsule (100 mg total) by mouth daily as needed.   30 capsule   2   . hydrocortisone (ANUSOL-HC) 2.5 % rectal cream   Rectal   Place 1 application rectally 2 (two) times daily.   30 g   0   . lidocaine (XYLOCAINE) 2 % solution   Mouth/Throat   Use as directed 20 mLs in the mouth or throat as needed for mouth pain.   100 mL   0   . meloxicam (MOBIC) 15 MG tablet   Oral   Take 1 tablet (15  mg total) by mouth daily.   30 tablet   0   . oxyCODONE-acetaminophen (PERCOCET) 5-325 MG tablet   Oral   Take 2 tablets by mouth every 6 (six) hours as needed for moderate pain or severe pain.   12 tablet   0     Allergies Review of patient's allergies indicates no known allergies.  History reviewed. No pertinent family history.  Social History Social History  Substance Use Topics  . Smoking status: Never Smoker   . Smokeless tobacco: None  . Alcohol Use: No     Comment: occasionally    Review of Systems Constitutional: Negative for fever. Cardiovascular: Negative for chest pain. Respiratory: Negative for shortness of breath. Gastrointestinal: Positive for lower abdominal pain. Genitourinary: Negative for dysuria. Musculoskeletal: Negative for back pain. Neurological: Negative for headache 10-point ROS otherwise negative.  ____________________________________________   PHYSICAL EXAM:  VITAL SIGNS: ED Triage Vitals  Enc Vitals Group     BP 06/10/16 0911 112/80 mmHg     Pulse Rate 06/10/16 0911 91     Resp 06/10/16 0911 20     Temp 06/10/16 0911 98.2 F (36.8 C)     Temp Source 06/10/16 0911 Oral     SpO2 06/10/16 0911 98 %     Weight 06/10/16 0911  216 lb (97.977 kg)     Height 06/10/16 0911 5\' 10"  (1.778 m)     Head Cir --      Peak Flow --      Pain Score 06/10/16 0912 8     Pain Loc --      Pain Edu? --      Excl. in [place]? --     Constitutional: Alert and oriented. Well appearing and in no distress. Eyes: Normal exam ENT   Head: Normocephalic and atraumatic.   Mouth/Throat: Mucous membranes are moist. Cardiovascular: Normal rate, regular rhythm. No murmur Respiratory: Normal respiratory effort without tachypnea nor retractions. Breath sounds are clear  Gastrointestinal: Soft, mild lower abdominal tenderness palpation, no rebound or guarding. No distention. Musculoskeletal: Nontender with normal range of motion in all extremities.   Neurologic:  Normal speech and language. No gross focal neurologic deficits  Skin:  Skin is warm, dry and intact.  Psychiatric: Mood and affect are normal. Speech and behavior are normal.  ____________________________________________     RADIOLOGY  Ultrasound consistent with 15 week 3 day intrauterine pregnancy.  ____________________________________________    INITIAL IMPRESSION / ASSESSMENT AND PLAN / ED COURSE  Pertinent labs & imaging results that were available during my care of the patient were reviewed by me and considered in my medical decision making (see chart for details).  Patient presents the emergency department lower abdominal discomfort. Describes it as a cramping/dull sensation. Overall the patient appears very well, no distress. We will check labs and obtain an ultrasound in the emergency department. Patient denies vaginal discharge or bleeding. Patient has not yet had her first OB appointment.  Labs are within normal limits. Ultrasound normal. Patient states abdominal pain is improved. We'll discharge with Tylenol as needed, instructed her to increase her fluids and follow up with her OB since possible. Patient agreeable. We discussed return precautions for worsening pain.  ____________________________________________   FINAL CLINICAL IMPRESSION(S) / ED DIAGNOSES  Abdominal pain Second trimester pregnancy  Harvest Dark, MD 06/10/16 1226

## 2016-06-10 NOTE — Discharge Instructions (Signed)
Abdominal Pain, Adult Many things can cause belly (abdominal) pain. Most times, the belly pain is not dangerous. Many cases of belly pain can be watched and treated at home. HOME CARE   Do not take medicines that help you go poop (laxatives) unless told to by your doctor.  Only take medicine as told by your doctor.  Eat or drink as told by your doctor. Your doctor will tell you if you should be on a special diet. GET HELP IF:  You do not know what is causing your belly pain.  You have belly pain while you are sick to your stomach (nauseous) or have runny poop (diarrhea).  You have pain while you pee or poop.  Your belly pain wakes you up at night.  You have belly pain that gets worse or better when you eat.  You have belly pain that gets worse when you eat fatty foods.  You have a fever. GET HELP RIGHT AWAY IF:   The pain does not go away within 2 hours.  You keep throwing up (vomiting).  The pain changes and is only in the right or left part of the belly.  You have bloody or tarry looking poop. MAKE SURE YOU:   Understand these instructions.  Will watch your condition.  Will get help right away if you are not doing well or get worse.   This information is not intended to replace advice given to you by your health care provider. Make sure you discuss any questions you have with your health care provider.   Document Released: 05/25/2008 Document Revised: 12/28/2014 Document Reviewed: 08/16/2013 Elsevier Interactive Patient Education 2016 [place] of Pregnancy The second trimester is from week 13 through week 28, months 4 through 6. The second trimester is often a time when you feel your best. Your body has also adjusted to being pregnant, and you begin to feel better physically. Usually, morning sickness has lessened or quit completely, you may have more energy, and you may have an increase in appetite. The second trimester is also a time when the  fetus is growing rapidly. At the end of the sixth month, the fetus is about 9 inches long and weighs about 1 pounds. You will likely begin to feel the baby move (quickening) between 18 and 20 weeks of the pregnancy. BODY CHANGES Your body goes through many changes during pregnancy. The changes vary from woman to woman.   Your weight will continue to increase. You will notice your lower abdomen bulging out.  You may begin to get stretch marks on your hips, abdomen, and breasts.  You may develop headaches that can be relieved by medicines approved by your health care provider.  You may urinate more often because the fetus is pressing on your bladder.  You may develop or continue to have heartburn as a result of your pregnancy.  You may develop constipation because certain hormones are causing the muscles that push waste through your intestines to slow down.  You may develop hemorrhoids or swollen, bulging veins (varicose veins).  You may have back pain because of the weight gain and pregnancy hormones relaxing your joints between the bones in your pelvis and as a result of a shift in weight and the muscles that support your balance.  Your breasts will continue to grow and be tender.  Your gums may bleed and may be sensitive to brushing and flossing.  Dark spots or blotches (chloasma, mask of pregnancy) may  develop on your face. This will likely fade after the baby is born.  A dark line from your belly button to the pubic area (linea nigra) may appear. This will likely fade after the baby is born.  You may have changes in your hair. These can include thickening of your hair, rapid growth, and changes in texture. Some women also have hair loss during or after pregnancy, or hair that feels dry or thin. Your hair will most likely return to normal after your baby is born. WHAT TO EXPECT AT YOUR PRENATAL VISITS During a routine prenatal visit:  You will be weighed to make sure you and the  fetus are growing normally.  Your blood pressure will be taken.  Your abdomen will be measured to track your baby's growth.  The fetal heartbeat will be listened to.  Any test results from the previous visit will be discussed. Your health care provider may ask you:  How you are feeling.  If you are feeling the baby move.  If you have had any abnormal symptoms, such as leaking fluid, bleeding, severe headaches, or abdominal cramping.  If you are using any tobacco products, including cigarettes, chewing tobacco, and electronic cigarettes.  If you have any questions. Other tests that may be performed during your second trimester include:  Blood tests that check for:  Low iron levels (anemia).  Gestational diabetes (between 24 and 28 weeks).  Rh antibodies.  Urine tests to check for infections, diabetes, or protein in the urine.  An ultrasound to confirm the proper growth and development of the baby.  An amniocentesis to check for possible genetic problems.  Fetal screens for spina bifida and Down syndrome.  HIV (human immunodeficiency virus) testing. Routine prenatal testing includes screening for HIV, unless you choose not to have this test. HOME CARE INSTRUCTIONS   Avoid all smoking, herbs, alcohol, and unprescribed drugs. These chemicals affect the formation and growth of the baby.  Do not use any tobacco products, including cigarettes, chewing tobacco, and electronic cigarettes. If you need help quitting, ask your health care provider. You may receive counseling support and other resources to help you quit.  Follow your health care provider's instructions regarding medicine use. There are medicines that are either safe or unsafe to take during pregnancy.  Exercise only as directed by your health care provider. Experiencing uterine cramps is a good sign to stop exercising.  Continue to eat regular, healthy meals.  Wear a good support bra for breast tenderness.  Do  not use hot tubs, steam rooms, or saunas.  Wear your seat belt at all times when driving.  Avoid raw meat, uncooked cheese, cat litter boxes, and soil used by cats. These carry germs that can cause birth defects in the baby.  Take your prenatal vitamins.  Take 1500-2000 mg of calcium daily starting at the 20th week of pregnancy until you deliver your baby.  Try taking a stool softener (if your health care provider approves) if you develop constipation. Eat more high-fiber foods, such as fresh vegetables or fruit and whole grains. Drink plenty of fluids to keep your urine clear or pale yellow.  Take warm sitz baths to soothe any pain or discomfort caused by hemorrhoids. Use hemorrhoid cream if your health care provider approves.  If you develop varicose veins, wear support hose. Elevate your feet for 15 minutes, 3-4 times a day. Limit salt in your diet.  Avoid heavy lifting, wear low heel shoes, and practice good posture.  Rest with your legs elevated if you have leg cramps or low back pain.  Visit your dentist if you have not gone yet during your pregnancy. Use a soft toothbrush to brush your teeth and be gentle when you floss.  A sexual relationship may be continued unless your health care provider directs you otherwise.  Continue to go to all your prenatal visits as directed by your health care provider. SEEK MEDICAL CARE IF:   You have dizziness.  You have mild pelvic cramps, pelvic pressure, or nagging pain in the abdominal area.  You have persistent nausea, vomiting, or diarrhea.  You have a bad smelling vaginal discharge.  You have pain with urination. SEEK IMMEDIATE MEDICAL CARE IF:   You have a fever.  You are leaking fluid from your vagina.  You have spotting or bleeding from your vagina.  You have severe abdominal cramping or pain.  You have rapid weight gain or loss.  You have shortness of breath with chest pain.  You notice sudden or extreme swelling of  your face, hands, ankles, feet, or legs.  You have not felt your baby move in over an hour.  You have severe headaches that do not go away with medicine.  You have vision changes.   This information is not intended to replace advice given to you by your health care provider. Make sure you discuss any questions you have with your health care provider.   Document Released: 12/01/2001 Document Revised: 12/28/2014 Document Reviewed: 02/07/2013 Elsevier Interactive Patient Education [employer].

## 2016-06-16 ENCOUNTER — Other Ambulatory Visit: Payer: Self-pay | Admitting: Family Medicine

## 2016-06-16 DIAGNOSIS — Z3490 Encounter for supervision of normal pregnancy, unspecified, unspecified trimester: Secondary | ICD-10-CM

## 2016-07-06 ENCOUNTER — Ambulatory Visit
Admission: RE | Admit: 2016-07-06 | Discharge: 2016-07-06 | Disposition: A | Discharge status: Home / Self Care | Payer: Medicare Other | Source: Ambulatory Visit | Attending: Family Medicine | Admitting: Family Medicine

## 2016-07-06 DIAGNOSIS — Z3490 Encounter for supervision of normal pregnancy, unspecified, unspecified trimester: Secondary | ICD-10-CM

## 2016-07-09 ENCOUNTER — Other Ambulatory Visit
Admission: RE | Admit: 2016-07-09 | Discharge: 2016-07-09 | Disposition: A | Discharge status: Home / Self Care | Payer: Medicare Other | Source: Ambulatory Visit | Attending: Maternal & Fetal Medicine | Admitting: Maternal & Fetal Medicine

## 2016-07-09 ENCOUNTER — Ambulatory Visit
Admission: RE | Admit: 2016-07-09 | Discharge: 2016-07-09 | Disposition: A | Discharge status: Home / Self Care | Payer: Medicare Other | Source: Ambulatory Visit | Attending: Maternal & Fetal Medicine | Admitting: Maternal & Fetal Medicine

## 2016-07-09 ENCOUNTER — Other Ambulatory Visit: Payer: Self-pay

## 2016-07-09 VITALS — BP 127/75 | HR 86 | Temp 98.6°F | Resp 18 | Wt 215.2 lb

## 2016-07-09 DIAGNOSIS — O09522 Supervision of elderly multigravida, second trimester: Secondary | ICD-10-CM

## 2016-07-09 DIAGNOSIS — O350XX0 Maternal care for (suspected) central nervous system malformation in fetus, not applicable or unspecified: Secondary | ICD-10-CM | POA: Diagnosis not present

## 2016-07-09 DIAGNOSIS — Z3A19 19 weeks gestation of pregnancy: Secondary | ICD-10-CM | POA: Diagnosis not present

## 2016-07-09 NOTE — Progress Notes (Signed)
Referring Provider:  Donnie Coffin Length of Consultation: 45 minutes  Ms. Boler was referred to [facility] for genetic counseling because of advanced maternal age.  The patient will be 40 years old at the time of delivery.  This note summarizes the information we discussed.    We explained that the chance of a chromosome abnormality increases with maternal age.  Chromosomes and examples of chromosome problems were reviewed.  Humans typically have 46 chromosomes in each cell, with half passed through each sperm and egg.  Any change in the number or structure of chromosomes can increase the risk of problems in the physical and mental development of a pregnancy.   Based upon age of the patient,  the midtrimester chance of any chromosome abnormality was 1 in 5. The chance of Down syndrome, the most common chromosome problem associated with maternal age, was 1 in 51.  The risk of chromosome problems is in addition to the 3% general population risk for birth defects and mental retardation.  The greatest chance, of course, is that the baby would be born in good health.  Based on the patient's estimated gestational age today, we discussed the following prenatal screening and testing options for this pregnancy:  Maternal serum marker screening, a blood test that measures pregnancy proteins, can provide risk assessments for Down syndrome, trisomy 71, and open neural tube defects (spina bifida, anencephaly). Because it does not directly examine the fetus, it cannot positively diagnose or rule out these problems.  Targeted ultrasound uses high frequency sound waves to create an image of the developing fetus.  An ultrasound is often recommended as a routine means of evaluating the pregnancy.  It is also used to screen for fetal anatomy problems (for example, a heart defect) that might be suggestive of a chromosomal or other abnormality.   Amniocentesis involves the removal of a Cayci Mcnabb amount of  amniotic fluid from the sac surrounding the fetus with the use of a thin needle inserted through the maternal abdomen and uterus.  Ultrasound guidance is used throughout the procedure.  Fetal cells from amniotic fluid are directly evaluated and > 99.5% of chromosome problems and > 98% of open neural tube defects can be detected. This procedure is generally performed after the 15th week of pregnancy.  The main risks to this procedure include complications leading to miscarriage in less than 1 in 200 cases (0.5%).  We also reviewed the availability of cell free fetal DNA testing from maternal blood to determine whether or not the baby may have either Down syndrome, trisomy 77, or trisomy 38.  This test utilizes a maternal blood sample and DNA sequencing technology to isolate circulating cell free fetal DNA from maternal plasma.  The fetal DNA can then be analyzed for DNA sequences that are derived from the three most common chromosomes involved in aneuploidy, chromosomes 13, 18, and 21.  If the overall amount of DNA is greater than the expected level for any of these chromosomes, aneuploidy is suspected.  While we do not consider it a replacement for invasive testing and karyotype analysis, a negative result from this testing would be reassuring, though not a guarantee of a normal chromosome complement for the baby.  An abnormal result is certainly suggestive of an abnormal chromosome complement, though we would still recommend CVS or amniocentesis to confirm any findings from this testing.  Cystic Fibrosis and Spinal Muscular Atrophy (SMA) screening were also discussed with the patient. Both conditions are recessive, which means  that both parents must be carriers in order to have a child with the disease.  Cystic fibrosis (CF) is one of the most common genetic conditions in persons of Caucasian ancestry.  This condition occurs in approximately 1 in 2,500 Caucasian persons and results in thickened secretions in  the lungs, digestive, and reproductive systems.  For a baby to be at risk for having CF, both of the parents must be carriers for this condition.  Approximately 1 in 72 Caucasian persons is a carrier for CF.  Current carrier testing looks for the most common mutations in the gene for CF and can detect approximately 90% of carriers in the Caucasian population.  This means that the carrier screening can greatly reduce, but cannot eliminate, the chance for an individual to have a child with CF.  If an individual is found to be a carrier for CF, then carrier testing would be available for the partner. As part of [place] newborn screening profile, all babies born in the state of [place] will have a two-tier screening process.  Specimens are first tested to determine the concentration of immunoreactive trypsinogen (IRT).  The top 5% of specimens with the highest IRT values then undergo DNA testing using a panel of over 40 common CF mutations. SMA is a neurodegenerative disorder that leads to atrophy of skeletal muscle and overall weakness.  This condition is also more prevalent in the Caucasian population, with 1 in 40-1 in 60 persons being a carrier and 1 in 6,000-1 in 10,000 children being affected.  There are multiple forms of the disease, with some causing death in infancy to other forms with survival into adulthood.  The genetics of SMA is complex, but carrier screening can detect up to 95% of carriers in the Caucasian population.  Similar to CF, a negative result can greatly reduce, but cannot eliminate, the chance to have a child with SMA.  Carrier screening was deferred today.  We obtained a detailed family history and pregnancy history.  The father of the baby is reported to have a 2 year old daughter diagnosed with autism and a two year old son who exhibits the same features as his sister but does not have a diagnosis.  The social situation with the father of the baby is complicated and,  although he is aware of the pregnancy, the patient does not feel she can reach out to him for additional information regarding his children's conditions.  With regard to the family history of autism, we explained that this condition is thought to be inherited in a mutifactorial manner, or due to a combination of genetic factors and environmental factors, or a characteristic of an underlying genetic condition that follows a traditional Mendelian inheritance pattern.  A proposed mechanism for autism is that an affected individual inherits changes in one or more genes associated with susceptibility for autism, and then undergoes some exposure or change during development that causes development of the symptoms of autism.  The suspected underlying genetic changes that predispose an individual to autism are complex, and not completely understood at this time.  The patient does not know whether they have had a formal genetics evaluation to date, and as such, no testing for predisposing genes would be suggested for the couple at this time.  The genetic contribution to autism is the subject of intense scientific investigation, and as more information becomes available, testing to identify a predisposition may be available to unaffected family members, but is not currently clinically  available.  Testing for symptomatic individuals is available in some cases, and testing is typically in the format of a panel of several genes that have been implicated in development of autism.   Because autism is thought to be inherited as a predisposition, even if we were to identify a genetic contribution to the condition prenatally, we would not be able to predict with certainty if a baby would ever develop symptoms of the condition.  Familial aggregation of autistic spectrum disorders is observed, and recurrence risk for a subsequent affected child for a couple who has a child with autistic spectrum disorder is approximately 5%, but may be  higher depending on the specific characteristics of the affected child.  Recurrence risks for second degree relatives of affected individuals have not been established, but are likely to be well under 5%.   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  The father of the baby's status is unknown and we are unlikely to have access to test him.  The patient appears to understand that there is up to a 1/40 risk for the baby to have some type of sickle cell disease and that, if testing cannot be performed on the FOB, that the newborn will be tested as part of the newborn screening program.  The remainder of the family history is unremarkable for birth defects, mental retardation, recurrent pregnancy loss or known chromosome abnormalities.  Ms. Hellmer revealed no pregnancy complications or exposures.    After consideration of the options, Ms. Buen elected to proceed with cell-free fetal DNA.  She would consider amniocentesis if her screening results are abnormal.  She was also offered a fetal echocardiogram and would like to have this study performed.  An ultrasound was performed at the time of the visit.  The gestational age was consistent with 28 weeks.  Routine fetal growth and anatomy appeared normal, although this cannot rule out all abnormalities.  Please refer to the ultrasound report for details of that study.  Ms. Knaggs was encouraged to call with questions or concerns.  We can be contacted at [phone].   Tests Ordered: Cell-free fetal DNA ([facility]

## 2016-07-09 NOTE — Progress Notes (Signed)
Agree with assessment and plan as outlined in [facility] Nunez's note.   In addition, we would recommend the following: 1. Follow up growth at ~28 weeks (scheduled).   2. Weekly antenatal testing at 36 weeks with NST/AFI.  Testing can be initiated sooner if indicted based on any additional risk factors during pregnancy.  3. Delivery by 40 weeks 4. Initiation of low dose 'baby' aspirin to reduce the risk for preeclampsia. 5. Fetal echocardiogram for additional aneuploidy screening at ~22 weeks (referral placed).

## 2016-07-15 LAB — INFORMASEQ(SM) WITH XY ANALYSIS
Fetal Fraction (%):: 10.2
Fetal Number: 1
Gestational Age at Collection: 19.3 weeks
WEIGHT: 215 [lb_av]

## 2016-07-16 ENCOUNTER — Telehealth: Payer: Self-pay | Admitting: Obstetrics and Gynecology

## 2016-07-16 NOTE — Telephone Encounter (Signed)
Jennifer Barton was informed by phone of the results of her recent InformaSeq testing (performed at [facility]) which yielded NEGATIVE results.  The patient's specimen showed DNA consistent with two copies of chromosomes 21, 18 and 13.  The sensitivity for trisomy 65, trisomy 29 and trisomy 69 using this testing are reported as 99.1%, 98.3% and 98.1% respectively.  Thus, while the results of this testing are highly accurate, they are not considered diagnostic at this time.  Should more definitive information be desired, the patient may still consider amniocentesis.   As requested to know by the patient, sex chromosome analysis was included for this sample.  Results was consistent with a female fetus (XY). This is predicted with >97% accuracy.  A maternal serum AFP only should be considered if screening for neural tube defects is desired.  Wilburt Finlay, MS, CGC

## 2016-08-26 ENCOUNTER — Observation Stay
Admission: EM | Admit: 2016-08-26 | Discharge: 2016-08-26 | Disposition: A | Discharge status: Home / Self Care | Payer: Medicare Other | Attending: Obstetrics and Gynecology | Admitting: Obstetrics and Gynecology

## 2016-08-26 ENCOUNTER — Encounter: Payer: Self-pay | Admitting: *Deleted

## 2016-08-26 DIAGNOSIS — O99012 Anemia complicating pregnancy, second trimester: Secondary | ICD-10-CM | POA: Insufficient documentation

## 2016-08-26 DIAGNOSIS — D649 Anemia, unspecified: Secondary | ICD-10-CM | POA: Insufficient documentation

## 2016-08-26 DIAGNOSIS — O26892 Other specified pregnancy related conditions, second trimester: Secondary | ICD-10-CM | POA: Diagnosis present

## 2016-08-26 DIAGNOSIS — R109 Unspecified abdominal pain: Secondary | ICD-10-CM | POA: Diagnosis present

## 2016-08-26 DIAGNOSIS — Z3A26 26 weeks gestation of pregnancy: Secondary | ICD-10-CM | POA: Diagnosis not present

## 2016-08-26 LAB — COMPREHENSIVE METABOLIC PANEL
ALK PHOS: 38 U/L (ref 38–126)
ALT: 11 U/L — AB (ref 14–54)
AST: 17 U/L (ref 15–41)
Albumin: 3 g/dL — ABNORMAL LOW (ref 3.5–5.0)
Anion gap: 7 (ref 5–15)
BILIRUBIN TOTAL: 0.5 mg/dL (ref 0.3–1.2)
BUN: 9 mg/dL (ref 6–20)
CALCIUM: 8.4 mg/dL — AB (ref 8.9–10.3)
CO2: 22 mmol/L (ref 22–32)
CREATININE: 0.48 mg/dL (ref 0.44–1.00)
Chloride: 104 mmol/L (ref 101–111)
GFR calc Af Amer: >60 mL/min (ref >60)
Glucose, Bld: 170 mg/dL — ABNORMAL HIGH (ref 65–99)
Potassium: 3.5 mmol/L (ref 3.5–5.1)
Sodium: 133 mmol/L — ABNORMAL LOW (ref 135–145)
TOTAL PROTEIN: 6.4 g/dL — AB (ref 6.5–8.1)

## 2016-08-26 LAB — URINALYSIS COMPLETE WITH MICROSCOPIC (ARMC ONLY)
BILIRUBIN URINE: NEGATIVE
GLUCOSE, UA: 150 mg/dL — AB
Hgb urine dipstick: NEGATIVE
Ketones, ur: NEGATIVE mg/dL
Leukocytes, UA: NEGATIVE
Nitrite: NEGATIVE
Protein, ur: NEGATIVE mg/dL
SPECIFIC GRAVITY, URINE: 1.005 (ref 1.005–1.030)
pH: 6 (ref 5.0–8.0)

## 2016-08-26 LAB — CBC
HEMATOCRIT: 28.5 % — AB (ref 35.0–47.0)
Hemoglobin: 10.4 g/dL — ABNORMAL LOW (ref 12.0–16.0)
MCH: 31.9 pg (ref 26.0–34.0)
MCHC: 36.4 g/dL — ABNORMAL HIGH (ref 32.0–36.0)
MCV: 87.6 fL (ref 80.0–100.0)
Platelets: 248 10*3/uL (ref 150–440)
RBC: 3.25 MIL/uL — AB (ref 3.80–5.20)
RDW: 13.8 % (ref 11.5–14.5)
WBC: 9.2 10*3/uL (ref 3.6–11.0)

## 2016-08-26 LAB — AMYLASE: Amylase: 51 U/L (ref 28–100)

## 2016-08-26 LAB — LIPASE, BLOOD: LIPASE: 24 U/L (ref 11–51)

## 2016-08-26 MED ORDER — CALCIUM CARBONATE ANTACID 500 MG PO CHEW: 2.0000 | CHEWABLE_TABLET | ORAL | Status: DC | PRN

## 2016-08-26 MED ORDER — ACETAMINOPHEN 325 MG PO TABS: 650.0000 mg | ORAL_TABLET | ORAL | Status: DC | PRN

## 2016-08-26 NOTE — Discharge Instructions (Signed)
Call MD for any questions.  Drink plenty of fluids.  Keep scheduled appt. at [facility] for next Thursday 09-03-16.

## 2016-08-26 NOTE — OB Triage Note (Signed)
Pt arrives via EMS this morning for upper abd. Pain.  States "no contractions, bleeding or discharge".  Feeling baby move well.  Was dx with GDM last week.  Has not picked up meter yet do to insurance.  Upper abd. Pain started 30 mins ago.  No intercourse in the last 24 hours.  Hx pancreatitis and gallbladder removal in 2001.  Had BV 2 mos ago and just started abx (pcn) 2 weeks ago for that and finished course.

## 2016-08-26 NOTE — Progress Notes (Signed)
Patient ID: Jennifer Barton, female   DOB: [birth year], 40 y.o.   MRN: [id number] [name] 03-31-1976 G3 P1 [redacted]w[redacted]d presents for RUQ pain this am at 8 am . S/p cholecystectomy . No N/V. Didn't eat this am . Remote h/o pancreatitis?? Cause   noLOF , no vaginal bleeding , O;BP 123/75   Pulse 94   Temp 98.2 F (36.8 C) (Oral)   Resp 18   LMP  (LMP Unknown)  ABDsoft gravid . Mild RUQ ttp , neg Murphy's . No rebound  CX not checked  NSTreactive Labs: nl amylase , lipase , NA+ 133 , Hct 28.5 A: upper adb pain mild . Possible gastritis  Anemia P:d/c home  Tylenol prn  Add ferrous sulfate bid to diet  RTC if symptoms worsen or persist or to [facility]
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
## 2016-09-17 ENCOUNTER — Ambulatory Visit
Admission: RE | Admit: 2016-09-17 | Discharge: 2016-09-17 | Disposition: A | Discharge status: Home / Self Care | Payer: Medicare Other | Source: Ambulatory Visit | Attending: Obstetrics & Gynecology | Admitting: Obstetrics & Gynecology

## 2016-09-17 DIAGNOSIS — Z3A29 29 weeks gestation of pregnancy: Secondary | ICD-10-CM | POA: Diagnosis not present

## 2016-09-17 DIAGNOSIS — IMO0002 Reserved for concepts with insufficient information to code with codable children: Secondary | ICD-10-CM

## 2016-09-17 DIAGNOSIS — O24419 Gestational diabetes mellitus in pregnancy, unspecified control: Secondary | ICD-10-CM | POA: Diagnosis not present

## 2016-09-17 HISTORY — DX: Anemia, unspecified: D64.9

## 2016-09-17 LAB — GLUCOSE, CAPILLARY: GLUCOSE-CAPILLARY: 124 mg/dL — AB (ref 65–99)

## 2016-10-12 ENCOUNTER — Other Ambulatory Visit: Payer: Self-pay | Admitting: *Deleted

## 2016-10-12 DIAGNOSIS — O09523 Supervision of elderly multigravida, third trimester: Secondary | ICD-10-CM

## 2016-10-14 ENCOUNTER — Observation Stay
Admission: EM | Admit: 2016-10-14 | Discharge: 2016-10-14 | Disposition: A | Discharge status: Home / Self Care | Payer: Medicare Other | Attending: Obstetrics and Gynecology | Admitting: Obstetrics and Gynecology

## 2016-10-14 DIAGNOSIS — O9A313 Physical abuse complicating pregnancy, third trimester: Principal | ICD-10-CM | POA: Insufficient documentation

## 2016-10-14 DIAGNOSIS — Z3A33 33 weeks gestation of pregnancy: Secondary | ICD-10-CM | POA: Insufficient documentation

## 2016-10-14 DIAGNOSIS — Z7984 Long term (current) use of oral hypoglycemic drugs: Secondary | ICD-10-CM | POA: Diagnosis not present

## 2016-10-14 DIAGNOSIS — O24415 Gestational diabetes mellitus in pregnancy, controlled by oral hypoglycemic drugs: Secondary | ICD-10-CM | POA: Insufficient documentation

## 2016-10-14 DIAGNOSIS — Z7982 Long term (current) use of aspirin: Secondary | ICD-10-CM | POA: Diagnosis not present

## 2016-10-14 DIAGNOSIS — Z3490 Encounter for supervision of normal pregnancy, unspecified, unspecified trimester: Secondary | ICD-10-CM

## 2016-10-14 MED ORDER — ACETAMINOPHEN 325 MG PO TABS
650.0000 mg | ORAL_TABLET | Freq: Four times a day (QID) | ORAL | Status: DC | PRN
  Administered 2016-10-14: 650 mg via ORAL
  Filled 2016-10-14: qty 2
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
## 2016-10-14 NOTE — Care Management Obs Status (Signed)
[facility] NOTIFICATION   Patient Details  Name: Jennifer Barton MRN: [id number] Date of Birth: 1976/04/16   Medicare Observation Status Notification Given:  Yes    Beau Fanny, RN 10/14/2016, 8:57 AM
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
## 2016-10-15 ENCOUNTER — Ambulatory Visit
Admission: RE | Admit: 2016-10-15 | Discharge: 2016-10-15 | Disposition: A | Discharge status: Home / Self Care | Payer: Medicare Other | Source: Ambulatory Visit | Attending: Obstetrics & Gynecology | Admitting: Obstetrics & Gynecology

## 2016-10-15 DIAGNOSIS — Z3A33 33 weeks gestation of pregnancy: Secondary | ICD-10-CM | POA: Diagnosis not present

## 2016-10-15 DIAGNOSIS — O09523 Supervision of elderly multigravida, third trimester: Secondary | ICD-10-CM

## 2016-10-15 DIAGNOSIS — O24419 Gestational diabetes mellitus in pregnancy, unspecified control: Secondary | ICD-10-CM | POA: Diagnosis present

## 2016-10-15 DIAGNOSIS — O3660X0 Maternal care for excessive fetal growth, unspecified trimester, not applicable or unspecified: Secondary | ICD-10-CM | POA: Insufficient documentation

## 2016-10-15 DIAGNOSIS — O3663X0 Maternal care for excessive fetal growth, third trimester, not applicable or unspecified: Secondary | ICD-10-CM

## 2016-10-15 LAB — GLUCOSE, CAPILLARY: Glucose-Capillary: 149 mg/dL — ABNORMAL HIGH (ref 65–99)

## 2016-10-29 ENCOUNTER — Encounter: Payer: Self-pay | Admitting: *Deleted

## 2016-10-29 ENCOUNTER — Encounter: Payer: Medicare Other | Attending: Obstetrics and Gynecology | Admitting: *Deleted

## 2016-10-29 VITALS — BP 110/76 | Ht 70.0 in | Wt 219.2 lb

## 2016-10-29 DIAGNOSIS — Z3A36 36 weeks gestation of pregnancy: Secondary | ICD-10-CM | POA: Diagnosis not present

## 2016-10-29 DIAGNOSIS — O24419 Gestational diabetes mellitus in pregnancy, unspecified control: Secondary | ICD-10-CM | POA: Diagnosis not present

## 2016-10-29 DIAGNOSIS — O24414 Gestational diabetes mellitus in pregnancy, insulin controlled: Secondary | ICD-10-CM

## 2016-10-29 NOTE — Patient Instructions (Signed)
Read booklet on Gestational Diabetes Follow Gestational Meal Planning Guidelines Avoid fruit juices Allow 2-3 hours between meals and snacks Complete a 3 Day Food Record and bring to next appointment Check blood sugars 4 x day - before breakfast and 2 hrs after every meal and record  Bring blood sugar log to all appointments Purchase urine ketone strips if ordered by MD and check urine ketones every am:  If + increase bedtime snack to 1 protein and 2 carbohydrate servings Walk 20-30 minutes at least 5 x week if permitted by MD Carry fast acting glucose and a snack at all times Rotate injection sites Take insulin per MD order - Regular 10 units and NPH  20 units before breakfast             Regular 7 units and NPH 8 units before supper

## 2016-10-30 NOTE — Progress Notes (Signed)
Diabetes Self-Management Education  Visit Type: First/Initial  Appt. Start Time: 1345 Appt. End Time: 4128  10/29/2016  Jennifer Barton, identified by name and date of birth, is a 40 y.o. female with a diagnosis of Diabetes: Gestational Diabetes.   ASSESSMENT  Blood pressure 110/76, height _0  (1.778 m), weight 219 lb 3.2 oz (99.4 kg). Body mass index is 31.45 kg/m.      Diabetes Self-Management Education - 10/29/16 1820      Visit Information   Visit Type First/Initial     Initial Visit   Diabetes Type Gestational Diabetes   Are you currently following a meal plan? No   Are you taking your medications as prescribed? No  has not started taking Aspirin again (has been 1 month) - reports she will pick up today; has not started Valtrex    Date Diagnosed 3 months ago     Health Coping   How would you rate your overall health? Fair     Psychosocial Assessment   Patient Belief/Attitude about Diabetes Other (comment)  "overwhelmed"   Self-care barriers None   Self-management support Friends;Doctor's office   Other persons present Friend   Patient Concerns Nutrition/Meal planning;Medication;Monitoring;Healthy Lifestyle;Problem Solving;Glycemic Control;Weight Control   Special Needs None   Preferred Learning Style Auditory;Visual;Hands on   Learning Readiness Ready   How often do you need to have someone help you when you read instructions, pamphlets, or other written materials from your doctor or pharmacy? 1 - Never   What is the last grade level you completed in school? 1 year college     Pre-Education Assessment   Patient understands the diabetes disease and treatment process. Needs Instruction   Patient understands incorporating nutritional management into lifestyle. Needs Instruction   Patient undertands incorporating physical activity into lifestyle. Needs Instruction   Patient understands using medications safely. Needs Instruction   Patient understands monitoring  blood glucose, interpreting and using results Needs Instruction   Patient understands prevention, detection, and treatment of acute complications. Needs Instruction   Patient understands prevention, detection, and treatment of chronic complications. Needs Instruction   Patient understands how to develop strategies to address psychosocial issues. Needs Instruction   Patient understands how to develop strategies to promote health/change behavior. Needs Instruction     Complications   How often do you check your blood sugar? 1-2 times/day  blood sugars have ranged from 78-214 mg/dL; from her meter she is not checking 4 x day and there were no readings in last 2 days; she doesn't remember if readings are before or after meals   Have you had a dilated eye exam in the past 12 months? No   Have you had a dental exam in the past 12 months? No   Are you checking your feet? No     Dietary Intake   Breakfast eggs, yogurt, leftovers such as rice, shrimp, steak   Snack (morning) she reports 5-6 snacks/day - apple, yogurt   Lunch sandwich, soup, cheese, some vegetables   Snack (afternoon) yogurt   Dinner eats out, meat vegetables   Snack (evening) yogurt   Beverage(s) water, juice, diet soda     Exercise   Exercise Type ADL's     Patient Education   Previous Diabetes Education No   Disease state  Definition of diabetes, type 1 and 2, and the diagnosis of diabetes   Nutrition management  Role of diet in the treatment of diabetes and the relationship between the three main macronutrients and  blood glucose level   Physical activity and exercise  Role of exercise on diabetes management, blood pressure control and cardiac health.   Medications Taught/reviewed insulin injection, site rotation, insulin storage and needle disposal.;Reviewed patients medication for diabetes, action, purpose, timing of dose and side effects. Pt injected 5 units NS to left abdomen subcutaneously without difficulty.     Monitoring Taught/evaluated SMBG meter.;Purpose and frequency of SMBG.;Taught/discussed recording of test results and interpretation of SMBG.;Ketone testing, when, how.   Acute complications Taught treatment of hypoglycemia - the 15 rule.   Chronic complications Relationship between chronic complications and blood glucose control   Psychosocial adjustment Identified and addressed patients feelings and concerns about diabetes   Preconception care Pregnancy and GDM  Role of pre-pregnancy blood glucose control on the development of the fetus;Reviewed with patient blood glucose goals with pregnancy;Role of family planning for patients with diabetes     Individualized Goals (developed by patient)   Reducing Risk Improve blood sugars Decrease medications Prevent diabetes complications Lose weight Lead a healthier lifestyle Become more fit     Outcomes   Expected Outcomes Demonstrated interest in learning. Expect positive outcomes      Individualized Plan for Diabetes Self-Management Training:   Learning Objective:  Patient will have a greater understanding of diabetes self-management. Patient education plan is to attend individual and/or group sessions per assessed needs and concerns.   Plan:   Patient Instructions  Read booklet on Gestational Diabetes Follow Gestational Meal Planning Guidelines Avoid fruit juices Allow 2-3 hours between meals and snacks Complete a 3 Day Food Record and bring to next appointment Check blood sugars 4 x day - before breakfast and 2 hrs after every meal and record  Bring blood sugar log to all appointments Purchase urine ketone strips if ordered by MD and check urine ketones every am:  If + increase bedtime snack to 1 protein and 2 carbohydrate servings Walk 20-30 minutes at least 5 x week if permitted by MD Carry fast acting glucose and a snack at all times Rotate injection sites Take insulin per MD order - Regular 10 units and NPH  20 units before  breakfast             Regular 7 units and NPH 8 units before supper  Expected Outcomes:  Demonstrated interest in learning. Expect positive outcomes  Education material provided:  Gestational Booklet Gestational Meal Planning Guidelines Viewed Gestational Diabetes Video 3 Day Food Record Goals for a Healthy Pregnancy Glucose tablets  Insulin start kit (BD) Symptoms, causes and treatments of Hypoglycemia  If problems or questions, patient to contact team via:  Johny Drilling, [place], [place], CDE [phone]  Future DSME appointment:  Pt to check her other appointments and call back to schedule appointment with the dietitian. She has to have someone bring her to the appointments. Dates and times given for her to choose.

## 2016-11-02 ENCOUNTER — Other Ambulatory Visit: Payer: Self-pay | Admitting: *Deleted

## 2016-11-03 ENCOUNTER — Telehealth: Payer: Self-pay | Admitting: *Deleted

## 2016-11-03 NOTE — Telephone Encounter (Signed)
Phone call to patient to schedule follow up dietitian appointment. She reports that she will see if her friend can bring her in the morning - 11/15 at 9:15 am. Patient reports that she saw MD yesterday and her insulin was increased to 10 units of R and N at supper. She didn't have her readings available but reports some improvement.

## 2016-11-04 ENCOUNTER — Encounter: Payer: Self-pay | Admitting: Dietician

## 2016-11-04 ENCOUNTER — Encounter: Payer: Medicare Other | Admitting: Dietician

## 2016-11-04 VITALS — BP 120/80 | Ht 70.0 in | Wt 226.1 lb

## 2016-11-04 DIAGNOSIS — O24419 Gestational diabetes mellitus in pregnancy, unspecified control: Secondary | ICD-10-CM | POA: Diagnosis not present

## 2016-11-04 DIAGNOSIS — O24414 Gestational diabetes mellitus in pregnancy, insulin controlled: Secondary | ICD-10-CM

## 2016-11-04 NOTE — Progress Notes (Signed)
   Patient's BG record indicates BGs are fluctuating. FBGs ranging 85-221, post-meal BGs ranging 90-134 since starting insulin.   Patient's food recall indicates likely inconsistent nutrient balance in meals. She reports eating regularly and working to limit portions of carbohydrate foods.    Provided 2000kcal meal plan, and wrote individualized menus based on patient's food preferences.  Instructed patient on food safety, including avoidance of Listeriosis, and limiting mercury from fish.  Patient spoke at length about personal issues that have been a major source of stress during this pregnancy. She feels she is now able to overcome much of the stress and is in better emotional health.   Discussed importance of maintaining healthy lifestyle habits to reduce risk of Type 2 DM as well as Gestational DM with any future pregnancies.  Advised patient to use any remaining testing supplies to test some BGs after delivery, and to have BG tested ideally annually, as well as prior to attempting future pregnancies.

## 2016-11-05 ENCOUNTER — Ambulatory Visit: Payer: Medicare Other

## 2016-11-10 ENCOUNTER — Telehealth: Payer: Self-pay | Admitting: Dietician

## 2016-11-10 NOTE — Telephone Encounter (Signed)
Called patient to check on progress; she is out of town currently and states labor will be induced when she returns. She reports BG of 90 this morning; ate yogurt and banana for breakfast. Encouraged her to call back if she has any questions or concerns.

## 2016-11-18 ENCOUNTER — Inpatient Hospital Stay
Admission: EM | Admit: 2016-11-18 | Discharge: 2016-11-22 | DRG: 765 | Disposition: A | Discharge status: Home / Self Care | Payer: Medicare Other | Attending: Obstetrics and Gynecology | Admitting: Obstetrics and Gynecology

## 2016-11-18 ENCOUNTER — Inpatient Hospital Stay: Payer: Medicare Other | Admitting: Anesthesiology

## 2016-11-18 ENCOUNTER — Encounter
Admission: EM | Disposition: A | Discharge status: Home / Self Care | Payer: Self-pay | Source: Home / Self Care | Attending: Obstetrics and Gynecology

## 2016-11-18 ENCOUNTER — Encounter: Payer: Self-pay | Admitting: Anesthesiology

## 2016-11-18 DIAGNOSIS — O9081 Anemia of the puerperium: Secondary | ICD-10-CM | POA: Diagnosis present

## 2016-11-18 DIAGNOSIS — Z3A39 39 weeks gestation of pregnancy: Secondary | ICD-10-CM | POA: Diagnosis not present

## 2016-11-18 DIAGNOSIS — O24424 Gestational diabetes mellitus in childbirth, insulin controlled: Secondary | ICD-10-CM | POA: Diagnosis present

## 2016-11-18 DIAGNOSIS — O3663X0 Maternal care for excessive fetal growth, third trimester, not applicable or unspecified: Secondary | ICD-10-CM | POA: Diagnosis present

## 2016-11-18 DIAGNOSIS — Z9889 Other specified postprocedural states: Secondary | ICD-10-CM

## 2016-11-18 DIAGNOSIS — O9832 Other infections with a predominantly sexual mode of transmission complicating childbirth: Secondary | ICD-10-CM | POA: Diagnosis present

## 2016-11-18 DIAGNOSIS — A6000 Herpesviral infection of urogenital system, unspecified: Secondary | ICD-10-CM | POA: Diagnosis present

## 2016-11-18 DIAGNOSIS — Z302 Encounter for sterilization: Secondary | ICD-10-CM | POA: Diagnosis not present

## 2016-11-18 DIAGNOSIS — O99824 Streptococcus B carrier state complicating childbirth: Secondary | ICD-10-CM | POA: Diagnosis present

## 2016-11-18 LAB — CBC
HEMATOCRIT: 32.9 % — AB (ref 35.0–47.0)
Hemoglobin: 11.8 g/dL — ABNORMAL LOW (ref 12.0–16.0)
MCH: 31.2 pg (ref 26.0–34.0)
MCHC: 36 g/dL (ref 32.0–36.0)
MCV: 86.8 fL (ref 80.0–100.0)
PLATELETS: 296 10*3/uL (ref 150–440)
RBC: 3.79 MIL/uL — ABNORMAL LOW (ref 3.80–5.20)
RDW: 14.4 % (ref 11.5–14.5)
WBC: 7.7 10*3/uL (ref 3.6–11.0)

## 2016-11-18 LAB — GLUCOSE, CAPILLARY
GLUCOSE-CAPILLARY: 103 mg/dL — AB (ref 65–99)
GLUCOSE-CAPILLARY: 173 mg/dL — AB (ref 65–99)
Glucose-Capillary: 92 mg/dL (ref 65–99)

## 2016-11-18 LAB — TYPE AND SCREEN
ABO/RH(D): O POS
Antibody Screen: NEGATIVE

## 2016-11-18 SURGERY — Surgical Case
Anesthesia: Spinal | Laterality: Bilateral | Wound class: Clean Contaminated

## 2016-11-18 MED ORDER — TETANUS-DIPHTH-ACELL PERTUSSIS 5-2.5-18.5 LF-MCG/0.5 IM SUSP: 0.5000 mL | Freq: Once | INTRAMUSCULAR | Status: DC

## 2016-11-18 MED ORDER — OXYCODONE-ACETAMINOPHEN 5-325 MG PO TABS
2.0000 | ORAL_TABLET | ORAL | Status: DC | PRN
  Administered 2016-11-18 – 2016-11-22 (×16): 2 via ORAL
  Filled 2016-11-18 (×16): qty 2

## 2016-11-18 MED ORDER — NALOXONE HCL 2 MG/2ML IJ SOSY: 1.0000 ug/kg/h | PREFILLED_SYRINGE | INTRAMUSCULAR | Status: DC | PRN

## 2016-11-18 MED ORDER — WITCH HAZEL-GLYCERIN EX PADS: 1.0000 "application " | MEDICATED_PAD | CUTANEOUS | Status: DC | PRN

## 2016-11-18 MED ORDER — ZOLPIDEM TARTRATE 5 MG PO TABS: 5.0000 mg | ORAL_TABLET | Freq: Every evening | ORAL | Status: DC | PRN

## 2016-11-18 MED ORDER — DIPHENHYDRAMINE HCL 25 MG PO CAPS: 25.0000 mg | ORAL_CAPSULE | ORAL | Status: DC | PRN

## 2016-11-18 MED ORDER — KETOROLAC TROMETHAMINE 30 MG/ML IJ SOLN: 30.0000 mg | Freq: Four times a day (QID) | INTRAMUSCULAR | Status: AC | PRN

## 2016-11-18 MED ORDER — NALBUPHINE HCL 10 MG/ML IJ SOLN: 5.0000 mg | INTRAMUSCULAR | Status: DC | PRN

## 2016-11-18 MED ORDER — ONDANSETRON HCL 4 MG/2ML IJ SOLN: 4.0000 mg | Freq: Three times a day (TID) | INTRAMUSCULAR | Status: DC | PRN

## 2016-11-18 MED ORDER — FENTANYL CITRATE (PF) 100 MCG/2ML IJ SOLN
25.0000 ug | INTRAMUSCULAR | Status: DC | PRN
  Administered 2016-11-18 (×2): 50 ug via INTRAVENOUS
  Filled 2016-11-18: qty 2

## 2016-11-18 MED ORDER — COCONUT OIL OIL: 1.0000 "application " | TOPICAL_OIL | Status: DC | PRN

## 2016-11-18 MED ORDER — MEPERIDINE HCL 25 MG/ML IJ SOLN: 6.2500 mg | INTRAMUSCULAR | Status: DC | PRN

## 2016-11-18 MED ORDER — LACTATED RINGERS IV SOLN
INTRAVENOUS | Status: DC
  Administered 2016-11-19: 09:00:00 via INTRAVENOUS

## 2016-11-18 MED ORDER — PRENATAL MULTIVITAMIN CH
1.0000 | ORAL_TABLET | Freq: Every day | ORAL | Status: DC
Start: 2016-11-19 — End: 2016-11-23
  Administered 2016-11-20 – 2016-11-22 (×3): 1 via ORAL
  Filled 2016-11-18 (×4): qty 1

## 2016-11-18 MED ORDER — SIMETHICONE 80 MG PO CHEW
80.0000 mg | CHEWABLE_TABLET | Freq: Three times a day (TID) | ORAL | Status: DC
  Administered 2016-11-19 – 2016-11-22 (×9): 80 mg via ORAL
  Filled 2016-11-18 (×9): qty 1

## 2016-11-18 MED ORDER — SIMETHICONE 80 MG PO CHEW: 80.0000 mg | CHEWABLE_TABLET | ORAL | Status: DC

## 2016-11-18 MED ORDER — MORPHINE SULFATE (PF) 0.5 MG/ML IJ SOLN
INTRAMUSCULAR | Status: DC | PRN
  Administered 2016-11-18: .1 mg via INTRATHECAL

## 2016-11-18 MED ORDER — BUPIVACAINE IN DEXTROSE 0.75-8.25 % IT SOLN
INTRATHECAL | Status: DC | PRN
  Administered 2016-11-18: 1.8 mL via INTRATHECAL

## 2016-11-18 MED ORDER — NALOXONE HCL 0.4 MG/ML IJ SOLN: 0.4000 mg | INTRAMUSCULAR | Status: DC | PRN

## 2016-11-18 MED ORDER — NALBUPHINE HCL 10 MG/ML IJ SOLN: 5.0000 mg | Freq: Once | INTRAMUSCULAR | Status: DC | PRN

## 2016-11-18 MED ORDER — CARBOPROST TROMETHAMINE 250 MCG/ML IM SOLN
INTRAMUSCULAR | Status: AC
  Filled 2016-11-18: qty 1

## 2016-11-18 MED ORDER — DEXTROSE 5 % IV SOLN
3.0000 g | Freq: Once | INTRAVENOUS | Status: AC
  Administered 2016-11-18: 3 g via INTRAVENOUS
  Filled 2016-11-18: qty 3000

## 2016-11-18 MED ORDER — PHENYLEPHRINE HCL 10 MG/ML IJ SOLN
INTRAMUSCULAR | Status: DC | PRN
  Administered 2016-11-18 (×3): 100 ug via INTRAVENOUS

## 2016-11-18 MED ORDER — SODIUM CHLORIDE 0.9% FLUSH: 3.0000 mL | INTRAVENOUS | Status: DC | PRN

## 2016-11-18 MED ORDER — FENTANYL CITRATE (PF) 100 MCG/2ML IJ SOLN
INTRAMUSCULAR | Status: DC | PRN
  Administered 2016-11-18: 10 ug via INTRATHECAL

## 2016-11-18 MED ORDER — MENTHOL 3 MG MT LOZG
1.0000 | LOZENGE | OROMUCOSAL | Status: DC | PRN
  Filled 2016-11-18: qty 9

## 2016-11-18 MED ORDER — LACTATED RINGERS IV SOLN
INTRAVENOUS | Status: DC | PRN
  Administered 2016-11-18: 14:00:00 via INTRAVENOUS

## 2016-11-18 MED ORDER — SIMETHICONE 80 MG PO CHEW
80.0000 mg | CHEWABLE_TABLET | ORAL | Status: DC | PRN
  Administered 2016-11-22: 80 mg via ORAL
  Filled 2016-11-18: qty 1

## 2016-11-18 MED ORDER — DIPHENHYDRAMINE HCL 50 MG/ML IJ SOLN: 12.5000 mg | INTRAMUSCULAR | Status: DC | PRN

## 2016-11-18 MED ORDER — DIBUCAINE 1 % RE OINT
1.0000 | TOPICAL_OINTMENT | RECTAL | Status: DC | PRN
Start: 2016-11-18 — End: 2016-11-23

## 2016-11-18 MED ORDER — ACETAMINOPHEN 325 MG PO TABS: 650.0000 mg | ORAL_TABLET | ORAL | Status: DC | PRN

## 2016-11-18 MED ORDER — ONDANSETRON HCL 4 MG/2ML IJ SOLN
INTRAMUSCULAR | Status: DC | PRN
  Administered 2016-11-18: 4 mg via INTRAVENOUS

## 2016-11-18 MED ORDER — OXYTOCIN 40 UNITS IN LACTATED RINGERS INFUSION - SIMPLE MED
INTRAVENOUS | Status: DC | PRN
  Administered 2016-11-18: 4 mL via INTRAVENOUS

## 2016-11-18 MED ORDER — OXYCODONE-ACETAMINOPHEN 5-325 MG PO TABS: 1.0000 | ORAL_TABLET | ORAL | Status: DC | PRN

## 2016-11-18 MED ORDER — OXYTOCIN 40 UNITS IN LACTATED RINGERS INFUSION - SIMPLE MED
2.5000 [IU]/h | INTRAVENOUS | Status: AC
  Administered 2016-11-18: 2.5 [IU]/h via INTRAVENOUS
  Filled 2016-11-18: qty 1000

## 2016-11-18 MED ORDER — KETOROLAC TROMETHAMINE 30 MG/ML IJ SOLN
30.0000 mg | Freq: Four times a day (QID) | INTRAMUSCULAR | Status: AC | PRN
  Administered 2016-11-18 – 2016-11-19 (×3): 30 mg via INTRAVENOUS
  Filled 2016-11-18 (×3): qty 1

## 2016-11-18 MED ORDER — IBUPROFEN 600 MG PO TABS
600.0000 mg | ORAL_TABLET | Freq: Four times a day (QID) | ORAL | Status: DC
  Administered 2016-11-19 – 2016-11-22 (×13): 600 mg via ORAL
  Filled 2016-11-18 (×13): qty 1

## 2016-11-18 MED ORDER — SOD CITRATE-CITRIC ACID 500-334 MG/5ML PO SOLN
ORAL | Status: AC
  Filled 2016-11-18: qty 15

## 2016-11-18 MED ORDER — ONDANSETRON HCL 4 MG/2ML IJ SOLN
4.0000 mg | Freq: Once | INTRAMUSCULAR | Status: AC | PRN
  Administered 2016-11-18: 4 mg via INTRAVENOUS
  Filled 2016-11-18: qty 2

## 2016-11-18 MED ORDER — METHYLERGONOVINE MALEATE 0.2 MG/ML IJ SOLN
INTRAMUSCULAR | Status: AC
  Filled 2016-11-18: qty 1

## 2016-11-18 MED ORDER — OXYCODONE HCL 5 MG PO TABS: 5.0000 mg | ORAL_TABLET | Freq: Four times a day (QID) | ORAL | Status: DC | PRN

## 2016-11-18 MED ORDER — SENNOSIDES-DOCUSATE SODIUM 8.6-50 MG PO TABS
2.0000 | ORAL_TABLET | ORAL | Status: DC
  Administered 2016-11-19 – 2016-11-22 (×3): 2 via ORAL
  Filled 2016-11-18 (×3): qty 2

## 2016-11-18 MED ORDER — DIPHENHYDRAMINE HCL 25 MG PO CAPS: 25.0000 mg | ORAL_CAPSULE | Freq: Four times a day (QID) | ORAL | Status: DC | PRN

## 2016-11-18 SURGICAL SUPPLY — 22 items
BARRIER ADHS 3X4 INTERCEED (GAUZE/BANDAGES/DRESSINGS) ×3 IMPLANT
CANISTER SUCT 3000ML (MISCELLANEOUS) ×3 IMPLANT
CATH KIT ON-Q SILVERSOAK 5IN (CATHETERS) ×6 IMPLANT
CHLORAPREP W/TINT 26ML (MISCELLANEOUS) ×3 IMPLANT
DRSG TELFA 3X8 NADH (GAUZE/BANDAGES/DRESSINGS) ×3 IMPLANT
ELECT CAUTERY BLADE 6.4 (BLADE) ×3 IMPLANT
ELECT REM PT RETURN 9FT ADLT (ELECTROSURGICAL) ×3
ELECTRODE REM PT RTRN 9FT ADLT (ELECTROSURGICAL) ×1 IMPLANT
GAUZE SPONGE 4X4 12PLY STRL (GAUZE/BANDAGES/DRESSINGS) ×3 IMPLANT
GLOVE BIO SURGEON STRL SZ8 (GLOVE) ×3 IMPLANT
GOWN STRL REUS W/ TWL LRG LVL3 (GOWN DISPOSABLE) ×2 IMPLANT
GOWN STRL REUS W/ TWL XL LVL3 (GOWN DISPOSABLE) ×1 IMPLANT
GOWN STRL REUS W/TWL LRG LVL3 (GOWN DISPOSABLE) ×4
GOWN STRL REUS W/TWL XL LVL3 (GOWN DISPOSABLE) ×2
NS IRRIG 1000ML POUR BTL (IV SOLUTION) ×3 IMPLANT
PACK C SECTION AR (MISCELLANEOUS) ×3 IMPLANT
PAD OB MATERNITY 4.3X12.25 (PERSONAL CARE ITEMS) ×3 IMPLANT
PAD PREP 24X41 OB/GYN DISP (PERSONAL CARE ITEMS) ×3 IMPLANT
STRAP SAFETY BODY (MISCELLANEOUS) ×3 IMPLANT
SUT CHROMIC 1 CTX 36 (SUTURE) ×9 IMPLANT
SUT PLAIN GUT 0 (SUTURE) ×6 IMPLANT
SUT VIC AB 0 CT1 36 (SUTURE) ×6 IMPLANT

## 2016-11-18 NOTE — Transfer of Care (Signed)
Immediate Anesthesia Transfer of Care Note  Patient: Jennifer Barton  Procedure(s) Performed: Procedure(s): CESAREAN SECTION and bilateral tubal ligation (Bilateral)  Patient Location: PACU and Women's Unit  Anesthesia Type:Spinal  Level of Consciousness: awake, alert  and oriented  Airway & Oxygen Therapy: Patient Spontanous Breathing  Post-op Assessment: Report given to RN and Post -op Vital signs reviewed and stable  Post vital signs: Reviewed and stable  Last Vitals:  Vitals:   11/18/16 1541 11/18/16 1542  BP: 110/88 112/73  Pulse: 75 73  Resp: 18 14  Temp: 36.3 C     Last Pain:  Vitals:   11/18/16 1541  TempSrc: Oral         Complications: No apparent anesthesia complications

## 2016-11-18 NOTE — OR Nursing (Signed)
Patient had solid food about an hour before surgery. Anesthesia made the decision to go ahead with C-Section because of D-Cells.

## 2016-11-18 NOTE — Progress Notes (Signed)
Chaplain received an order that a pt in room 351 would like to speak with a chaplain. Pt was in good spirits after delivering her baby. Provided the ministry of prayer and emotional support    11/18/16 1919  Clinical Encounter Type  Visited With Patient;Patient and family together  Visit Type Initial;Spiritual support  Referral From Nurse  Consult/Referral To Chaplain  Spiritual Encounters  Spiritual Needs Prayer;Emotional

## 2016-11-18 NOTE — Anesthesia Preprocedure Evaluation (Signed)
Anesthesia Evaluation  Patient identified by MRN, date of birth, ID band Patient awake    Reviewed: Allergy & Precautions, H&P , NPO status , Patient's Chart, lab work & pertinent test results, reviewed documented beta blocker date and time   History of Anesthesia Complications Negative for: history of anesthetic complications  Airway Mallampati: I  TM Distance: >3 FB Neck ROM: full    Dental no notable dental hx. (+) Missing   Pulmonary neg pulmonary ROS,           Cardiovascular Exercise Tolerance: Good hypertension, (-) angina(-) CAD, (-) Past MI, (-) Cardiac Stents and (-) CABG (-) dysrhythmias (-) Valvular Problems/Murmurs     Neuro/Psych PSYCHIATRIC DISORDERS (Depression') negative neurological ROS     GI/Hepatic negative GI ROS, Neg liver ROS,   Endo/Other  diabetes  Renal/GU negative Renal ROS  negative genitourinary   Musculoskeletal   Abdominal   Peds  Hematology  (+) Blood dyscrasia, anemia ,   Anesthesia Other Findings Past Medical History: No date: Anemia No date: Depression No date: Gestational diabetes No date: Hypertension No date: Pancreatitis   Reproductive/Obstetrics (+) Pregnancy                             Anesthesia Physical Anesthesia Plan  ASA: III  Anesthesia Plan: Spinal   Post-op Pain Management:    Induction:   Airway Management Planned:   Additional Equipment:   Intra-op Plan:   Post-operative Plan:   Informed Consent: I have reviewed the patients History and Physical, chart, labs and discussed the procedure including the risks, benefits and alternatives for the proposed anesthesia with the patient or authorized representative who has indicated his/her understanding and acceptance.   Dental Advisory Given  Plan Discussed with: Anesthesiologist, CRNA and Surgeon  Anesthesia Plan Comments:         Anesthesia Quick Evaluation

## 2016-11-18 NOTE — H&P (Signed)
Jennifer Barton is a 40 y.o. female presenting for A2 GDM insulin dep . Admitted at 39 +0 week . Upon monitoring fetus is having recurrent late decels . OB History    Gravida Para Term Preterm AB Living   3 1 1   1 1    SAB TAB Ectopic Multiple Live Births   1       1     Past Medical History:  Diagnosis Date  . Anemia   . Depression   . Gestational diabetes   . Hypertension   . Pancreatitis    Past Surgical History:  Procedure Laterality Date  . CHOLECYSTECTOMY     Family History: family history is not on file. Social History:  reports that she has never smoked. She has never used smokeless tobacco. She reports that she does not drink alcohol or use drugs.     Maternal Diabetes: Yes:  Diabetes Type:  Insulin/Medication controlled Genetic Screening: Normal Maternal Ultrasounds/Referrals: Normal Fetal Ultrasounds or other Referrals:  Fetal echo, normal Maternal Substance Abuse:  No Significant Maternal Medications:  Meds include: Other: insulin NPH + humalog  Significant Maternal Lab Results:  None Other Comments:  None  ROS History   Temperature 97.9 F (36.6 C), temperature source Oral, resp. rate 20, height 5\' 10"  (1.778 m), weight 226 lb (102.5 kg). Exam Physical Exam  Lungs CTA  CV RRR  adb Efw 10#  EFM : recurrent lates . 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

## 2016-11-18 NOTE — Brief Op Note (Signed)
11/18/2016  3:29 PM  PATIENT:  Jennifer Barton  40 y.o. female  PRE-OPERATIVE DIAGNOSIS:  A2 GDM insulin , recurrent late decels  Elective sterilization POST-OPERATIVE DIAGNOSIS:  Same as above  Fetal macrosomia    PROCEDURE:  Procedure(s): CESAREAN SECTION and bilateral tubal ligation (Bilateral)  SURGEON:  Surgeon(s) and Role:    Boykin Nearing, MD - Primary  PHYSICIAN ASSISTANT: scrub    ASSISTANTS: none   ANESTHESIA:   spinal  EBL:  Total I/O In: -  Out: 500 [Blood:500] IOF 1700 cc  BLOOD ADMINISTERED:none  DRAINS: Urinary Catheter (Foley)   LOCAL MEDICATIONS USED:  NONE  SPECIMEN:  Source of Specimen:  portion right and left tube   DISPOSITION OF SPECIMEN:  PATHOLOGY  COUNTS:  YES  TOURNIQUET:  * No tourniquets in log *  DICTATION: .Other Dictation: Dictation Number verbal  PLAN OF CARE: Admit to inpatient   PATIENT DISPOSITION:  PACU - hemodynamically stable.   Delay start of Pharmacological VTE agent (>24hrs) due to surgical blood loss or risk of bleeding: not applicable

## 2016-11-18 NOTE — Anesthesia Procedure Notes (Signed)
Spinal  Patient location during procedure: OR Start time: 11/18/2016 2:27 PM End time: 11/18/2016 2:29 PM Staffing Anesthesiologist: Martha Clan Resident/CRNA: Jonna Clark Performed: resident/CRNA  Preanesthetic Checklist Completed: patient identified, site marked, surgical consent, pre-op evaluation, timeout performed, IV checked, risks and benefits discussed and monitors and equipment checked Spinal Block Patient position: sitting Prep: ChloraPrep Patient monitoring: heart rate, continuous pulse ox, blood pressure and cardiac monitor Approach: midline Location: L3-4 Injection technique: single-shot Needle Needle type: Whitacre and Introducer  Needle gauge: 24 G Needle length: 10 cm Assessment Sensory level: T10 Additional Notes Negative paresthesia. Negative blood return. Positive free-flowing CSF. Expiration date of kit checked and confirmed. Patient tolerated procedure well, without complications.

## 2016-11-19 ENCOUNTER — Encounter: Payer: Self-pay | Admitting: Obstetrics and Gynecology

## 2016-11-19 LAB — GLUCOSE, CAPILLARY
GLUCOSE-CAPILLARY: 168 mg/dL — AB (ref 65–99)
GLUCOSE-CAPILLARY: 122 mg/dL — AB (ref 65–99)
Glucose-Capillary: 128 mg/dL — ABNORMAL HIGH (ref 65–99)
Glucose-Capillary: 134 mg/dL — ABNORMAL HIGH (ref 65–99)
Glucose-Capillary: 143 mg/dL — ABNORMAL HIGH (ref 65–99)
Glucose-Capillary: 184 mg/dL — ABNORMAL HIGH (ref 65–99)

## 2016-11-19 LAB — CBC
HCT: 27.5 % — ABNORMAL LOW (ref 35.0–47.0)
HEMOGLOBIN: 10 g/dL — AB (ref 12.0–16.0)
MCH: 31.1 pg (ref 26.0–34.0)
MCHC: 36.2 g/dL — AB (ref 32.0–36.0)
MCV: 85.8 fL (ref 80.0–100.0)
PLATELETS: 241 10*3/uL (ref 150–440)
RBC: 3.21 MIL/uL — ABNORMAL LOW (ref 3.80–5.20)
RDW: 14.3 % (ref 11.5–14.5)
WBC: 10.6 10*3/uL (ref 3.6–11.0)

## 2016-11-19 LAB — RPR: RPR: NONREACTIVE

## 2016-11-19 NOTE — Progress Notes (Signed)
Dr. Ouida Sills notified of diet order to confirm that he wanted Regular diet. He confirmed he did want a regular diet

## 2016-11-19 NOTE — Anesthesia Postprocedure Evaluation (Signed)
Anesthesia Post Note  Patient: Jennifer Barton  Procedure(s) Performed: Procedure(s) (LRB): CESAREAN SECTION and bilateral tubal ligation (Bilateral)  Patient location during evaluation: Mother Baby Anesthesia Type: Spinal Level of consciousness: oriented and awake and alert Pain management: pain level controlled Vital Signs Assessment: post-procedure vital signs reviewed and stable Respiratory status: spontaneous breathing, respiratory function stable and patient connected to nasal cannula oxygen Cardiovascular status: blood pressure returned to baseline and stable Postop Assessment: no headache and no backache Anesthetic complications: no    Last Vitals:  Vitals:   11/19/16 0407 11/19/16 0724  BP: 123/69 116/77  Pulse: 87 83  Resp: 20 18  Temp: 36.7 C 36.6 C    Last Pain:  Vitals:   11/19/16 0724  TempSrc: Oral  PainSc:                  Alison Stalling

## 2016-11-19 NOTE — Op Note (Signed)
NAME:  Jennifer Barton, Jennifer Barton NO.:  [id number]  MEDICAL RECORD NO.:  [id number]  LOCATION:                                 FACILITY:  PHYSICIAN:  Laverta Baltimore, MDDATE OF BIRTH:  Apr 22, 1976  DATE OF PROCEDURE: DATE OF DISCHARGE:                              OPERATIVE REPORT   PREOPERATIVE DIAGNOSIS: 1. 39+ 0 weeks estimated gestational age. 2. A2 gestational diabetic, insulin-dependent. 3. Recurrent late decelerations. 4. Elective permanent sterilization. 5. Fetal macrosomia.  POSTOPERATIVE DIAGNOSIS: 1. 39+ 0 weeks estimated gestational age. 2. A2 gestational diabetic, insulin-dependent. 3. Recurrent late decelerations. 4. Elective permanent sterilization. 5. Fetal macrosomia.  ANESTHESIA:  Spinal.  SURGEON:  Laverta Baltimore, MD.  INDICATION:  A 40 year old gravida 2, para 1 patient admitted for labor induction given her A2 gestational diabetic status requiring insulin. When the patient was worked up for fetal monitoring, she demonstrated recurrent late decelerations.  The patient at that time was offered elective permanent sterilization, which she accepted.  DESCRIPTION OF PROCEDURE:  After adequate spinal anesthesia, patient was placed in dorsal supine position.  Hip roll was placed onto the right side.  The patient's abdomen was prepped and draped in normal sterile fashion.  She did receive 3 g IV Ancef for surgical prophylaxis.  Time- out was performed.  A Pfannenstiel incision was made 2 fingerbreadths above the symphysis pubis.  Sharp dissection was used to identify the fascia.  Fascia was opened in the midline and opened in a transverse fashion.  The superior aspect of the fascia was grasped with Kocher clamps and the recti muscles were dissected free.  Inferior aspect of the fascia was grasped with Kocher clamps and pyramidalis muscles dissected free.  Sharp dissection was used to gain entrance into the peritoneal cavity.  The vesicular  uterine peritoneal fold was identified and a bladder flap was created and the bladder was reflected inferiorly. A low transverse uterine incision was made upon entry into the endometrial cavity.  Clear fluid resulted.  Uterine incision was extended with blunt transverse traction.  Fetal head was brought to the incision and the flat kiwi was applied to the fetal occiput to aid in delivery.  With 1 pull, the head delivered.  This was aided by fundal pressure.  Vacuum was removed.  Fetal shoulders and body were then delivered.  Extremely large female infant was vigorous while being dried on the patient's abdomen after 60 seconds.  Cord was clamped and cut and female infant was passed to neonatology staff who assigned Apgar scores of 9 and 9, weight 5010 g.  Delivery time was 1449 on November 18, 2016. Placenta was manually extracted and the uterus was externalized and the endometrial cavity was wiped clean with laparotomy tape.  Ring forcep was used to open the cervix and this was passed off the operative field. The uterine incision was closed with 1 chromic suture in a running locking fashion.  Good approximation of edges.  Good hemostasis noted. Attention was directed to the patient's right fallopian tube which was grasped at the midportion of the fallopian tube.  Two separate 0 plain gut sutures were applied to the right portion of the fallopian tube  and a 1.5 cm portion of fallopian tube was removed.  A similar procedure was repeated on the patient's left fallopian tube after grasping the fallopian tube in the midportion, 2 separate 0 plain gut sutures were applied and a 1.5 cm portion of fallopian tube was removed.  Good hemostasis was noted.  Posterior cul-de-sac was irrigated and suctioned. Uterus was placed back into the abdominal cavity.  Paracolic gutters were wiped clean and both tubal ligation sites appeared hemostatic. Uterine incision appeared hemostatic.  The fascia was then closed  with 0 Vicryl suture in a running nonlocking fashion.  Good approximation of edges.  Good hemostasis was noted.  Subcutaneous tissues were irrigated and bovied for hemostasis and the skin was reapproximated with Insorb, absorbable staples, good cosmetic effect.  A loose dressing applied on top of this.  The patient tolerated the procedure well.  ESTIMATED BLOOD LOSS:  500.  INTRAOPERATIVE FLUIDS:  1700 mL.  The patient was taken to recovery room in good condition.    ______________________________ Laverta Baltimore, MD   ______________________________ Laverta Baltimore, MD    TS/MEDQ  D:  11/18/2016  T:  11/19/2016  Job:  [id number]

## 2016-11-19 NOTE — Care Management Important Message (Signed)
Important Message  Patient Details  Name: Jennifer Barton MRN: [id number] Date of Birth: [birth year]   Medicare Important Message Given:  Yes    Shelbie Ammons, RN 11/19/2016, 10:36 AM

## 2016-11-19 NOTE — Progress Notes (Signed)
POSTOPERATIVE DAY # 1 S/P Primary LTCS for recurrent late decelerations, fetal macrosomia    S:         Reports feeling "sore" felt dizzy this morning when she went to the nursery prior to eating breakfast - nothing since              Tolerating po intake / no nausea / no vomiting / no flatus / no BM - pt. Taking Simethicone, but hasn't passed flatus             Bleeding is light             Pain controlled with Motrin and Percocet             Up ad lib / ambulatory/ voiding QS  Newborn formula feeding - Baby currently in NICU for blood glucose stabilization    O:  VS: BP 116/70 (BP Location: Right Arm)   Pulse 91   Temp 98.9 F (37.2 C) (Oral)   Resp 18   Ht 5\' 10"  (1.778 m)   Wt 102.5 kg (226 lb)   LMP  (LMP Unknown)   SpO2 99%   Breastfeeding? Unknown   BMI 32.43 kg/m    LABS:               Recent Labs  11/18/16 1400 11/19/16 0447  WBC 7.7 10.6  HGB 11.8* 10.0*  PLT 296 241               Bloodtype: --/--/O POS (11/29 1400)  Rubella:    Immune                                           I&O: Intake/Output      11/29 0701 - 11/30 0700 11/30 0701 - 12/01 0700   Urine (mL/kg/hr) 725 900 (1.4)   Blood 500    Total Output 1225 900   Net -1225 -900                     Physical Exam:             Alert and Oriented X3  Lungs: Clear and unlabored  Heart: regular rate and rhythm / no mumurs  Abdomen: soft, non-tender, non-distended, active bowel sounds in all 4 quadrants              Fundus: firm, non-tender, U-1             Dressing: C/D/pressure dressing starting to peel off on left side - pt. Declined removal              Incision:  approximated with Insborb staples / unable to visualize dsg   Perineum: intact  Lochia: appropriate  Extremities: no edema, no calf pain or tenderness  A:        POD # 1 S/P Primary LTCS for recurrent late decelerations, fetal macrosomia   Mild ABL Anemia             A2 GDM - insulin dependent, stable  P:        Routine  postoperative care              Call if CBG >225 or <60 per Dr. Tonette Bihari recommendations  Needs 2 hour GTT at 6 week visit   May remove/replace dsg today  Call if not passing flatus by  this evening   Continue Current Care  Lars Pinks, CNM

## 2016-11-19 NOTE — Anesthesia Post-op Follow-up Note (Signed)
  Anesthesia Pain Follow-up Note  Patient: Jennifer Barton  Day #: 1  Date of Follow-up: 11/19/2016 Time: 8:31 AM  Last Vitals:  Vitals:   11/19/16 0407 11/19/16 0724  BP: 123/69 116/77  Pulse: 87 83  Resp: 20 18  Temp: 36.7 C 36.6 C    Level of Consciousness: alert  Pain: 3 /10   Side Effects:Pruritis  Catheter Site Exam:clean, dry, no drainage     Plan: D/C from anesthesia care  Alison Stalling

## 2016-11-20 LAB — GLUCOSE, CAPILLARY: Glucose-Capillary: 118 mg/dL — ABNORMAL HIGH (ref 65–99)

## 2016-11-20 LAB — SURGICAL PATHOLOGY

## 2016-11-20 NOTE — Progress Notes (Signed)
Subjective: Postpartum Day 2: primary LTCesarean Delivery for recurrent lates. Hx of gDMA2.  Dizziness on ambulation yesterday resolved today. Baby still in SCN for sugar control Patient reports incisional pain.    Objective: Vital signs in last 24 hours: Temp:  [98 F (36.7 C)-98.9 F (37.2 C)] 98 F (36.7 C) (12/01 0730) Pulse Rate:  [78-96] 85 (12/01 0730) Resp:  [18] 18 (12/01 0730) BP: (98-116)/(59-70) 116/64 (12/01 0730) SpO2:  [98 %-100 %] 98 % (12/01 0730)  Physical Exam:  General: alert, cooperative and appears stated age 40: appropriate Uterine Fundus: firm Incision: healing well, no significant drainage, no dehiscence, no significant erythema DVT Evaluation: No evidence of DVT seen on physical exam. Negative Homan's sign.   Recent Labs  11/18/16 1400 11/19/16 0447  HGB 11.8* 10.0*  HCT 32.9* 27.5*    Assessment/Plan: Status post Cesarean section. Doing well postoperatively.  - stop q4hr blood sugar checks Continue current care.  Benjaman Kindler 11/20/2016, 7:58 AM

## 2016-11-21 NOTE — Progress Notes (Signed)
  Subjective:   Doing well.  No complaints.  Voiding, ambulating, tolerating regular PO diet, tolerating pain with PO meds.  Still having incisional pain. Denies: CP SOB F/C, N/V, calf pain   Newborn still in SCN due to glycemic control  Objective:  Blood pressure 126/73, pulse 81, temperature 98.2 F (36.8 C), temperature source Oral, resp. rate 16, height 5\' 10"  (1.778 m), weight 102.5 kg (226 lb), SpO2 100 %, unknown if currently breastfeeding.  General: NAD Pulmonary: no increased work of breathing Abdomen: non-distended, non-tender, fundus firm at level of umbilicus Incision: c/d/i Extremities: no edema, no erythema, no tenderness   Assessment:   40 y.o. [id number] postoperative day # 3 from primary LTCS + BTL for persistent category 2 strip   Plan:  1) Acute blood loss anemia - hemodynamically stable and asymptomatic - po ferrous sulfate  2) reviewed pain expectations and pain scale, continue ambulation  3) TDAP status UTD 08/2016  4) GDM - no longer checking BS, will need pp 2hr GTT  5) Disposition - continue inpatient admission  ----- Larey Days, MD Attending Obstetrician and Gynecologist [facility], Department of [facility]

## 2016-11-22 MED ORDER — ONDANSETRON 4 MG PO TBDP: 4.0000 mg | ORAL_TABLET | Freq: Three times a day (TID) | ORAL | 0 refills | Status: DC | PRN

## 2016-11-22 MED ORDER — ASCORBIC ACID 250 MG PO TABS: 250.0000 mg | ORAL_TABLET | Freq: Two times a day (BID) | ORAL | 3 refills | Status: AC

## 2016-11-22 MED ORDER — OXYCODONE-ACETAMINOPHEN 5-325 MG PO TABS: 1.0000 | ORAL_TABLET | ORAL | 0 refills | Status: DC | PRN

## 2016-11-22 MED ORDER — IBUPROFEN 800 MG PO TABS: 800.0000 mg | ORAL_TABLET | Freq: Three times a day (TID) | ORAL | 0 refills | Status: AC | PRN

## 2016-11-22 MED ORDER — DOCUSATE SODIUM 100 MG PO CAPS: 100.0000 mg | ORAL_CAPSULE | Freq: Every day | ORAL | 3 refills | Status: DC | PRN

## 2016-11-22 NOTE — Discharge Summary (Signed)
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 Post-operative state 11/18/2016  . 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 Pregnant and not yet delivered 10/14/2016  . Gestational diabetes 09/17/2016  . Abdominal pain 08/26/2016  . 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 Self-inflicted injury [id number]  . Elevated glucose [id number]    Complications: None Intrapartum complications/course: Admitted for gDMA2 and recurrent late decels resulted in urgent primary cesarean section. Baby is still in SCN for glucose control at POD#4, day of discharge. Date of Delivery: 11/18/16 Delivered By: Jennifer Barton Delivery Type: primary cesarean section, low transverse incision Anesthesia: epidural Placenta: sponatneous Newborn Data: Live born female  Birth Weight: 11 lb 0.7 oz (5010 g) APGAR: 9, 9    Discharge Physical Exam: BP 131/73 (BP Location: Left Arm)   Pulse 96   Temp 98.7 F (37.1 C) (Oral)   Resp 16   Ht 5\' 10"  (1.778 m)   Wt 226 lb (102.5 kg)   LMP  (LMP Unknown)   SpO2 99%   Breastfeeding? Unknown   BMI 32.43 kg/m   General: NAD CV: RRR Pulm: CTABL, nl effort ABD: s/nd/nt, fundus firm and below the umbilicus Lochia: moderate Incision: c/d/i DVT Evaluation: LE non-ttp, no evidence of DVT on exam.  Hemoglobin  Date Value Ref Range Status  11/19/2016 10.0 (L) 12.0 - 16.0 g/dL Final   HCT  Date Value Ref Range Status  11/19/2016 27.5 (L) 35.0 - 47.0 % Final    Post partum course:  Uncomplicated Postpartum Procedures: none Disposition: stable, discharge to home. Baby Feeding: formula Baby Disposition: Special care nursery  Rh Immune globulin given: n/a Rubella vaccine given: n/a  Contraception: BTL   Prenatal Labs: O+/ rubellaIMM / var IMM. GBS +   Plan:  Jennifer Barton was discharged to home in good condition. Follow-up appointment at [facility]*  Discharge Medications:   Medication List    STOP taking these medications   insulin NPH Human 100 UNIT/ML injection Commonly known as:  HUMULIN N,NOVOLIN N   insulin regular 250 units/2.52mL (100 units/mL) injection Commonly known as:  NOVOLIN R,HUMULIN R   valACYclovir 500 MG tablet Commonly known as:  VALTREX     TAKE these medications   aspirin 81 MG chewable tablet Chew 81 mg by mouth once.   docusate sodium 100 MG capsule Commonly known as:  COLACE Take 1 capsule (100 mg total) by mouth daily as needed for mild constipation.   ferrous sulfate 325 (65 FE) MG tablet Take 325 mg by mouth daily.   ibuprofen 800 MG tablet Commonly known as:  ADVIL,MOTRIN Take 1 tablet (800 mg total) by mouth every 8 (eight) hours as needed for moderate pain or cramping.   ondansetron 4 MG disintegrating tablet Commonly known as:  ZOFRAN ODT Take 1 tablet (4 mg total) by mouth every 8 (eight) hours as needed for nausea or vomiting.   oxyCODONE-acetaminophen 5-325 MG tablet Commonly known as:  PERCOCET/ROXICET Take 1 tablet by mouth every 4 (four) hours as needed (pain scale 4-7).   prenatal multivitamin Tabs tablet Take 1 tablet by mouth  daily at 12 noon.      Postop day #4 doing well. Baby still needing support in the SCN.  1. Acute blood loss anemai: po iron and vitamine C 2. gDMA2- 2hr gtt postpartum 3. D/C home today  Signed: Benjaman Barton, [place]

## 2016-11-22 NOTE — Progress Notes (Signed)
Discharged home via w/c to car.

## 2019-01-21 ENCOUNTER — Emergency Department
Admission: EM | Admit: 2019-01-21 | Discharge: 2019-01-21 | Disposition: A | Discharge status: Home / Self Care | Payer: Medicare Other | Attending: Emergency Medicine | Admitting: Emergency Medicine

## 2019-01-21 ENCOUNTER — Other Ambulatory Visit: Payer: Self-pay

## 2019-01-21 ENCOUNTER — Encounter: Payer: Self-pay | Admitting: Emergency Medicine

## 2019-01-21 DIAGNOSIS — J029 Acute pharyngitis, unspecified: Secondary | ICD-10-CM | POA: Diagnosis present

## 2019-01-21 DIAGNOSIS — B9789 Other viral agents as the cause of diseases classified elsewhere: Secondary | ICD-10-CM

## 2019-01-21 DIAGNOSIS — J028 Acute pharyngitis due to other specified organisms: Secondary | ICD-10-CM

## 2019-01-21 LAB — GROUP A STREP BY PCR: GROUP A STREP BY PCR: NOT DETECTED

## 2019-01-21 NOTE — ED Triage Notes (Signed)
Pt c/o soreness to right throat/submandibular gland when swallowing. Pt states "its not that painful and only sore when I swallow but since I was bringing my son I thought I would get it checked".  No exudate to tonsils.  Small white spot on pts tongue noted.

## 2019-01-21 NOTE — ED Provider Notes (Signed)
[facility] Emergency Department Provider Note  ____________________________________________   First MD Initiated Contact with Patient 01/21/19 1718     (approximate)  I have reviewed the triage vital signs and the nursing notes.   HISTORY  Chief Complaint Sore Throat    HPI Jennifer Barton is a 43 y.o. female presents emergency department with her child.  She states that since her child was being checked for the flu she thought she should get checked today.  She states she has had some throat pain with swallowing.  No fever or chills.  No vomiting or diarrhea.  No cough or congestion.    Past Medical History:  Diagnosis Date  . Anemia   . Depression   . Hypertension   . Pancreatitis     Patient Active Problem List   Diagnosis Date Noted  . Post-operative state 11/18/2016  . 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 Pregnant and not yet delivered 10/14/2016  . Gestational diabetes 09/17/2016  . Abdominal pain 08/26/2016  . 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 Self-inflicted injury 25/95/6387  . Elevated glucose 01/07/2016    Past Surgical History:  Procedure Laterality Date  . CESAREAN SECTION Bilateral 11/18/2016   Procedure: CESAREAN SECTION and bilateral tubal ligation;  Surgeon: Boykin Nearing, MD;  Location: [facility] ORS;  Service: Obstetrics;  Laterality: Bilateral;  . CHOLECYSTECTOMY      Prior to Admission medications   Medication Sig Start Date End Date Taking? Authorizing Provider  aspirin 81 MG chewable tablet Chew 81 mg by mouth once.    [provider]  docusate sodium (COLACE) 100 MG capsule Take 1 capsule (100 mg total) by mouth daily as needed for mild constipation. 11/22/16   Benjaman Kindler, MD  ferrous sulfate 325 (65 FE) MG tablet Take 325 mg by mouth daily.    [provider]  ondansetron (ZOFRAN ODT) 4 MG disintegrating tablet Take 1 tablet  (4 mg total) by mouth every 8 (eight) hours as needed for nausea or vomiting. 11/22/16   Benjaman Kindler, MD  oxyCODONE-acetaminophen (PERCOCET/ROXICET) 5-325 MG tablet Take 1 tablet by mouth every 4 (four) hours as needed (pain scale 4-7). 11/22/16   Benjaman Kindler, MD  Prenatal Vit-Fe Fumarate-FA (PRENATAL MULTIVITAMIN) TABS tablet Take 1 tablet by mouth daily at 12 noon.    [provider]    Allergies Patient has no known allergies.  History reviewed. No pertinent family history.  Social History Social History   Tobacco Use  . Smoking status: Never Smoker  . Smokeless tobacco: Never Used  Substance Use Topics  . Alcohol use: No    Comment: occasionally  - prior to pregnancy   . Drug use: No    Review of Systems  Constitutional: No fever/chills Eyes: No visual changes. ENT: Positive sore throat. Respiratory: Denies cough Genitourinary: Negative for dysuria. Musculoskeletal: Negative for back pain. Skin: Negative for rash.    ____________________________________________   PHYSICAL EXAM:  VITAL SIGNS: ED Triage Vitals  Enc Vitals Group     BP 01/21/19 1611 (!) 164/99     Pulse Rate 01/21/19 1610 93     Resp 01/21/19 1610 18     Temp 01/21/19 1610 98.3 F (36.8 C)     Temp Source 01/21/19 1610 Oral     SpO2 01/21/19 1611 100 %     Weight 01/21/19 1611 230 lb (104.3 kg)     Height 01/21/19 1611 5\' 10"  (1.778  m)     Head Circumference --      Peak Flow --      Pain Score 01/21/19 1611 0     Pain Loc --      Pain Edu? --      Excl. in [place]? --     Constitutional: Alert and oriented. Well appearing and in no acute distress. Eyes: Conjunctivae are normal.  Head: Atraumatic. Nose: No congestion/rhinnorhea. Mouth/Throat: Mucous membranes are moist.  Throat is minimally red Neck:  supple no lymphadenopathy noted Cardiovascular: Normal rate, regular rhythm. Heart sounds are normal Respiratory: Normal respiratory effort.  No retractions, lungs c t a    GU: deferred Musculoskeletal: FROM all extremities, warm and well perfused Neurologic:  Normal speech and language.  Skin:  Skin is warm, dry and intact. No rash noted. Psychiatric: Mood and affect are normal. Speech and behavior are normal.  ____________________________________________   LABS (all labs ordered are listed, but only abnormal results are displayed)  Labs Reviewed  GROUP A STREP BY PCR   ____________________________________________   ____________________________________________  RADIOLOGY    ____________________________________________   PROCEDURES  Procedure(s) performed: No  Procedures    ____________________________________________   INITIAL IMPRESSION / ASSESSMENT AND PLAN / ED COURSE  Pertinent labs & imaging results that were available during my care of the patient were reviewed by me and considered in my medical decision making (see chart for details).   Patient is 43 year old female presents emergency department sore throat.  Physical exam is basically unremarkable.  Strep test ordered from triage is negative  Explained test findings to the patient.  She is to gargle with warm salt water.  Take Tylenol and ibuprofen for pain if needed.  Return if worsening.  Follow-up with regular doctor if not better in 3 days.  She states she understands will comply.  She is discharged in stable condition.     As part of my medical decision making, I reviewed the following data within the [facility] notes reviewed and incorporated, Labs reviewed strep test is negative, Old chart reviewed, Notes from prior ED visits and [place] Controlled Substance Database  ____________________________________________   FINAL CLINICAL IMPRESSION(S) / ED DIAGNOSES  Final diagnoses:  Acute viral pharyngitis      NEW MEDICATIONS STARTED DURING THIS VISIT:  New Prescriptions   No medications on file     Note:  This document was prepared  using Dragon voice recognition software and may include unintentional dictation errors.    Versie Starks, PA-C 01/21/19 1744    Harvest Dark, MD 01/21/19 2132

## 2019-01-21 NOTE — Discharge Instructions (Addendum)
Gargle with warm salt water.  Tylenol and ibuprofen as needed for fever.  Return if worsening

## 2019-03-28 ENCOUNTER — Emergency Department
Admission: EM | Admit: 2019-03-28 | Discharge: 2019-03-28 | Disposition: A | Discharge status: Home / Self Care | Payer: Medicare Other | Attending: Emergency Medicine | Admitting: Emergency Medicine

## 2019-03-28 ENCOUNTER — Other Ambulatory Visit: Payer: Self-pay

## 2019-03-28 ENCOUNTER — Emergency Department: Payer: Medicare Other

## 2019-03-28 ENCOUNTER — Encounter: Payer: Self-pay | Admitting: Emergency Medicine

## 2019-03-28 DIAGNOSIS — R0602 Shortness of breath: Secondary | ICD-10-CM | POA: Diagnosis present

## 2019-03-28 DIAGNOSIS — I10 Essential (primary) hypertension: Secondary | ICD-10-CM | POA: Diagnosis not present

## 2019-03-28 DIAGNOSIS — Z79899 Other long term (current) drug therapy: Secondary | ICD-10-CM | POA: Diagnosis not present

## 2019-03-28 DIAGNOSIS — J189 Pneumonia, unspecified organism: Secondary | ICD-10-CM

## 2019-03-28 DIAGNOSIS — Z7982 Long term (current) use of aspirin: Secondary | ICD-10-CM | POA: Diagnosis not present

## 2019-03-28 MED ORDER — AZITHROMYCIN 250 MG PO TABS: ORAL_TABLET | ORAL | 0 refills | Status: AC

## 2019-03-28 NOTE — ED Triage Notes (Signed)
Patient to ER for c/o shortness of breath for a few days. Patient denies any fever or cough. Denies any general malaise. States it just feels difficult to get a deep breath. Patient in no acute distress at this time.

## 2019-03-28 NOTE — ED Provider Notes (Signed)
[facility] Emergency Department Provider Note  Time seen: 9:11 PM  I have reviewed the triage vital signs and the nursing notes.   HISTORY  Chief Complaint Shortness of Breath    HPI Jennifer Barton is a 43 y.o. female the past medical history of anemia, depression, hypertension presents to the emergency department for shortness of breath.  According to the patient over the past 2 days she has felt shortness of breath at times like she cannot get a full deep breath of air.  States she does not know if this is allergies or just anxiety, but she is worried about coronavirus.  Patient denies any fever, no cough or recent travel or known sick contacts.  Overall the patient appears extremely well, 97% room air saturation, no respiratory distress.  Mildly anxious in appearance.   Past Medical History:  Diagnosis Date  . Anemia   . Depression   . Hypertension   . Pancreatitis     Patient Active Problem List   Diagnosis Date Noted  . Post-operative state 11/18/2016  . 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 Pregnant and not yet delivered 10/14/2016  . Gestational diabetes 09/17/2016  . Abdominal pain 08/26/2016  . 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 Self-inflicted injury 37/85/8850  . Elevated glucose 01/07/2016    Past Surgical History:  Procedure Laterality Date  . CESAREAN SECTION Bilateral 11/18/2016   Procedure: CESAREAN SECTION and bilateral tubal ligation;  Surgeon: Boykin Nearing, MD;  Location: [facility] ORS;  Service: Obstetrics;  Laterality: Bilateral;  . CHOLECYSTECTOMY      Prior to Admission medications   Medication Sig Start Date End Date Taking? Authorizing Provider  aspirin 81 MG chewable tablet Chew 81 mg by mouth once.    [provider]  docusate sodium (COLACE) 100 MG capsule Take 1 capsule (100 mg total) by mouth daily as needed for mild constipation. 11/22/16    Benjaman Kindler, MD  ferrous sulfate 325 (65 FE) MG tablet Take 325 mg by mouth daily.    [provider]  ondansetron (ZOFRAN ODT) 4 MG disintegrating tablet Take 1 tablet (4 mg total) by mouth every 8 (eight) hours as needed for nausea or vomiting. 11/22/16   Benjaman Kindler, MD  oxyCODONE-acetaminophen (PERCOCET/ROXICET) 5-325 MG tablet Take 1 tablet by mouth every 4 (four) hours as needed (pain scale 4-7). 11/22/16   Benjaman Kindler, MD  Prenatal Vit-Fe Fumarate-FA (PRENATAL MULTIVITAMIN) TABS tablet Take 1 tablet by mouth daily at 12 noon.    [provider]    No Known Allergies  No family history on file.  Social History Social History   Tobacco Use  . Smoking status: Never Smoker  . Smokeless tobacco: Never Used  Substance Use Topics  . Alcohol use: No    Comment: occasionally  - prior to pregnancy   . Drug use: No    Review of Systems Constitutional: Negative for fever. ENT: Negative for recent illness/congestion Cardiovascular: Negative for chest pain. Respiratory: Intermittent mild shortness of breath x2 days. Gastrointestinal: Negative for abdominal pain, vomiting Musculoskeletal: Negative for musculoskeletal complaints Skin: Negative for skin complaints  Neurological: Negative for headache All other ROS negative  ____________________________________________   PHYSICAL EXAM:  VITAL SIGNS: ED Triage Vitals  Enc Vitals Group     BP 03/28/19 2023 (!) 164/99     Pulse Rate 03/28/19 2023 (!) 113     Resp 03/28/19 2023 20  Temp 03/28/19 2023 98.3 F (36.8 C)     Temp Source 03/28/19 2023 Oral     SpO2 03/28/19 2023 97 %     Weight 03/28/19 2024 240 lb (108.9 kg)     Height 03/28/19 2024 5\' 10"  (1.778 m)     Head Circumference --      Peak Flow --      Pain Score 03/28/19 2024 0     Pain Loc --      Pain Edu? --      Excl. in [place]? --    Constitutional: Alert and oriented. Well appearing and in no distress. Eyes: Normal exam ENT    Head: Normocephalic and atraumatic.   Mouth/Throat: Mucous membranes are moist. Cardiovascular: Normal rate, regular rhythm.  Respiratory: Normal respiratory effort without tachypnea nor retractions. Breath sounds are clear  Gastrointestinal: Soft and nontender. No distention.   Musculoskeletal: Nontender with normal range of motion in all extremities.  Neurologic:  Normal speech and language. No gross focal neurologic deficits Skin:  Skin is warm, dry and intact.  Psychiatric: Mood and affect are normal.   ____________________________________________   RADIOLOGY  X-ray shows atelectasis versus very early pneumonia on the right lung base.  ____________________________________________   INITIAL IMPRESSION / ASSESSMENT AND PLAN / ED COURSE  Pertinent labs & imaging results that were available during my care of the patient were reviewed by me and considered in my medical decision making (see chart for details).  Patient presents emergency department for intermittent shortness of breath over the past 2 days.  Denies any cough fever or recent travel.  Overall the patient appears well.  Denies any chest pain at any point.  Very low suspicion for coronavirus.  We will check a chest x-ray as a precaution.  Patient agreeable to plan of care.  X-ray shows atelectasis versus early pneumonia right lung base.  Given the patient has mild shortness of breath we will cover with a short course of Zithromax.  Patient agreeable to plan of care.   Jennifer Barton was evaluated in Emergency Department on 03/28/2019 for the symptoms described in the history of present illness. She was evaluated in the context of the global COVID-19 pandemic, which necessitated consideration that the patient might be at risk for infection with the SARS-CoV-2 virus that causes COVID-19. Institutional protocols and algorithms that pertain to the evaluation of patients at risk for COVID-19 are in a state of rapid change based on  information released by regulatory bodies including the CDC and federal and state organizations. These policies and algorithms were followed during the patient's care in the ED.   ____________________________________________   FINAL CLINICAL IMPRESSION(S) / ED DIAGNOSES  Dyspnea   Harvest Dark, MD 03/28/19 2203

## 2019-05-17 ENCOUNTER — Other Ambulatory Visit
Admission: RE | Admit: 2019-05-17 | Discharge: 2019-05-17 | Disposition: A | Discharge status: Home / Self Care | Payer: Medicare Other | Source: Ambulatory Visit | Attending: General Surgery | Admitting: General Surgery

## 2019-05-17 ENCOUNTER — Other Ambulatory Visit: Payer: Self-pay | Admitting: Family Medicine

## 2019-05-17 ENCOUNTER — Ambulatory Visit
Admission: RE | Admit: 2019-05-17 | Discharge: 2019-05-17 | Disposition: A | Discharge status: Home / Self Care | Payer: Medicare Other | Source: Ambulatory Visit | Attending: Family Medicine | Admitting: Family Medicine

## 2019-05-17 DIAGNOSIS — R0602 Shortness of breath: Secondary | ICD-10-CM

## 2019-10-03 ENCOUNTER — Ambulatory Visit
Admission: RE | Admit: 2019-10-03 | Discharge: 2019-10-03 | Disposition: A | Discharge status: Home / Self Care | Payer: Medicare Other | Source: Ambulatory Visit | Attending: Pulmonary Disease | Admitting: Pulmonary Disease

## 2019-10-03 ENCOUNTER — Ambulatory Visit: Payer: Medicare Other | Admitting: Pulmonary Disease

## 2019-10-03 ENCOUNTER — Institutional Professional Consult (permissible substitution): Payer: Medicare Other | Admitting: Pulmonary Disease

## 2019-10-03 ENCOUNTER — Other Ambulatory Visit
Admission: RE | Admit: 2019-10-03 | Discharge: 2019-10-03 | Disposition: A | Discharge status: Home / Self Care | Payer: Medicare Other | Source: Ambulatory Visit | Attending: Pulmonary Disease | Admitting: Pulmonary Disease

## 2019-10-03 ENCOUNTER — Encounter: Payer: Self-pay | Admitting: Pulmonary Disease

## 2019-10-03 ENCOUNTER — Other Ambulatory Visit: Payer: Self-pay

## 2019-10-03 VITALS — BP 132/84 | HR 87 | Temp 97.5°F | Ht 70.0 in | Wt 278.0 lb

## 2019-10-03 DIAGNOSIS — R0602 Shortness of breath: Secondary | ICD-10-CM

## 2019-10-03 DIAGNOSIS — E6609 Other obesity due to excess calories: Secondary | ICD-10-CM

## 2019-10-03 DIAGNOSIS — F419 Anxiety disorder, unspecified: Secondary | ICD-10-CM

## 2019-10-03 DIAGNOSIS — Z8701 Personal history of pneumonia (recurrent): Secondary | ICD-10-CM

## 2019-10-03 DIAGNOSIS — Z6839 Body mass index (BMI) 39.0-39.9, adult: Secondary | ICD-10-CM

## 2019-10-03 LAB — CBC WITH DIFFERENTIAL/PLATELET
Abs Immature Granulocytes: 0.02 10*3/uL (ref 0.00–0.07)
Basophils Absolute: 0.1 10*3/uL (ref 0.0–0.1)
Basophils Relative: 1 %
Eosinophils Absolute: 0.4 10*3/uL (ref 0.0–0.5)
Eosinophils Relative: 5 %
HCT: 33.8 % — ABNORMAL LOW (ref 36.0–46.0)
Hemoglobin: 12 g/dL (ref 12.0–15.0)
Immature Granulocytes: 0 %
Lymphocytes Relative: 37 %
Lymphs Abs: 3 10*3/uL (ref 0.7–4.0)
MCH: 31.7 pg (ref 26.0–34.0)
MCHC: 35.5 g/dL (ref 30.0–36.0)
MCV: 89.4 fL (ref 80.0–100.0)
Monocytes Absolute: 0.3 10*3/uL (ref 0.1–1.0)
Monocytes Relative: 3 %
Neutro Abs: 4.4 10*3/uL (ref 1.7–7.7)
Neutrophils Relative %: 54 %
Platelets: 252 10*3/uL (ref 150–400)
RBC: 3.78 MIL/uL — ABNORMAL LOW (ref 3.87–5.11)
RDW: 12.7 % (ref 11.5–15.5)
WBC: 8.1 10*3/uL (ref 4.0–10.5)
nRBC: 0 % (ref 0.0–0.2)

## 2019-10-03 NOTE — Progress Notes (Signed)
 Subjective:    Patient ID: Jennifer Barton, female    DOB: 16-Jul-1976, 43 y.o.   MRN: [id number]  HPI  Consult (Referred by PA Verneita Deems for PNE. Seen for SOB back in April. Reports the SOB has improved but she still has the SOB. She is using albuterol prn. She reports she still has a lot of mucous production which iw white in color and thick. She is using mucinex daily. She reports she thinks she has mold in her apartment. )  Review of Systems  Constitutional: Positive for unexpected weight change (Weight gain).  HENT: Negative.   Eyes: Negative.   Respiratory: Positive for shortness of breath.   Cardiovascular: Negative.   Gastrointestinal:       Reflux symptoms  Endocrine: Negative.   Genitourinary: Negative.   Musculoskeletal: Negative.   Skin: Negative.   Neurological: Negative.   Hematological: Negative.   Psychiatric/Behavioral: The patient is nervous/anxious.        Depression  All other systems reviewed and are negative.      Objective:   Physical Exam Vitals signs and nursing note reviewed.  Constitutional:      General: She is not in acute distress.    Appearance: Normal appearance. She is obese. She is not ill-appearing.  HENT:     Head: Normocephalic and atraumatic.     Right Ear: External ear normal.     Left Ear: External ear normal.     Nose:     Comments: Nose/mouth/throat not examined due to masking requirements for COVID 19. Eyes:     General: No scleral icterus.    Conjunctiva/sclera: Conjunctivae normal.     Pupils: Pupils are equal, round, and reactive to light.  Neck:     Musculoskeletal: Neck supple.     Thyroid: No thyromegaly.     Vascular: No JVD.     Trachea: Trachea and phonation normal.  Cardiovascular:     Rate and Rhythm: Normal rate and regular rhythm.  Pulmonary:     Effort: Pulmonary effort is normal.     Breath sounds: Normal breath sounds.     Comments: Pattern of breathing from upper chest. Abdominal:     General: Abdomen  is protuberant. There is no distension.     Palpations: Abdomen is soft.  Musculoskeletal: Normal range of motion.     Right lower leg: No edema.     Left lower leg: No edema.  Lymphadenopathy:     Cervical: No cervical adenopathy.  Skin:    General: Skin is warm and dry.  Neurological:     General: No focal deficit present.     Mental Status: She is alert and oriented to person, place, and time.  Psychiatric:        Mood and Affect: Mood is anxious.        Speech: Speech normal.        Behavior: Behavior is cooperative.    Vitals:   10/03/19 1614  BP: 132/84  Pulse: 87  Temp: (!) 97.5 F (36.4 C)  Height: 5' 10 (1.778 m)  Weight: 278 lb (126.1 kg)  SpO2: 96%  TempSrc: Temporal  BMI (Calculated): 39.89   Assessment & Plan:  1. SOB (shortness of breath) (Primary) - DG Chest 2 View; Future - CBC w/Diff; Future - Allergens w/Total IgE Area 2; Future  2. Anxiety  3. Class 2 obesity due to excess calories with body mass index (BMI) of 39.0 to 39.9 in adult, unspecified whether serious  comorbidity present  4. Hx of bacterial pneumonia   Patient Instructions  1.  We will schedule breathing tests.  2.  We will repeat a chest x-ray to make sure pneumonia has totally resolved.  3.  We will check you for low blood and also check for potential allergies.  4.  We will see him follow-up after your breathing test.  Please note: late entry documentation due to logistical difficulties during COVID-19 pandemic. This note is filed for information purposes only, and is not intended to be used for billing, nor does it represent the full scope/nature of the visit in question. Please see any associated scanned media linked to date of encounter for additional pertinent information.

## 2019-10-03 NOTE — Patient Instructions (Signed)
1.  We will schedule breathing tests.  2.  We will repeat a chest x-ray to make sure pneumonia has totally resolved.  3.  We will check you for low blood and also check for potential allergies.  4.  We will see him follow-up after your breathing test.

## 2019-10-05 ENCOUNTER — Institutional Professional Consult (permissible substitution): Payer: Medicare Other | Admitting: Pulmonary Disease

## 2019-10-05 LAB — ALLERGENS W/TOTAL IGE AREA 2

## 2020-01-16 ENCOUNTER — Ambulatory Visit: Payer: Medicare Other

## 2020-03-08 IMAGING — CR CHEST - 2 VIEW
2 series · 2 of 2 positions shown · non-contrast
Comparison: 03/28/2019

CLINICAL DATA: Shortness of breath

EXAM:
CHEST - 2 VIEW

[chest pa]
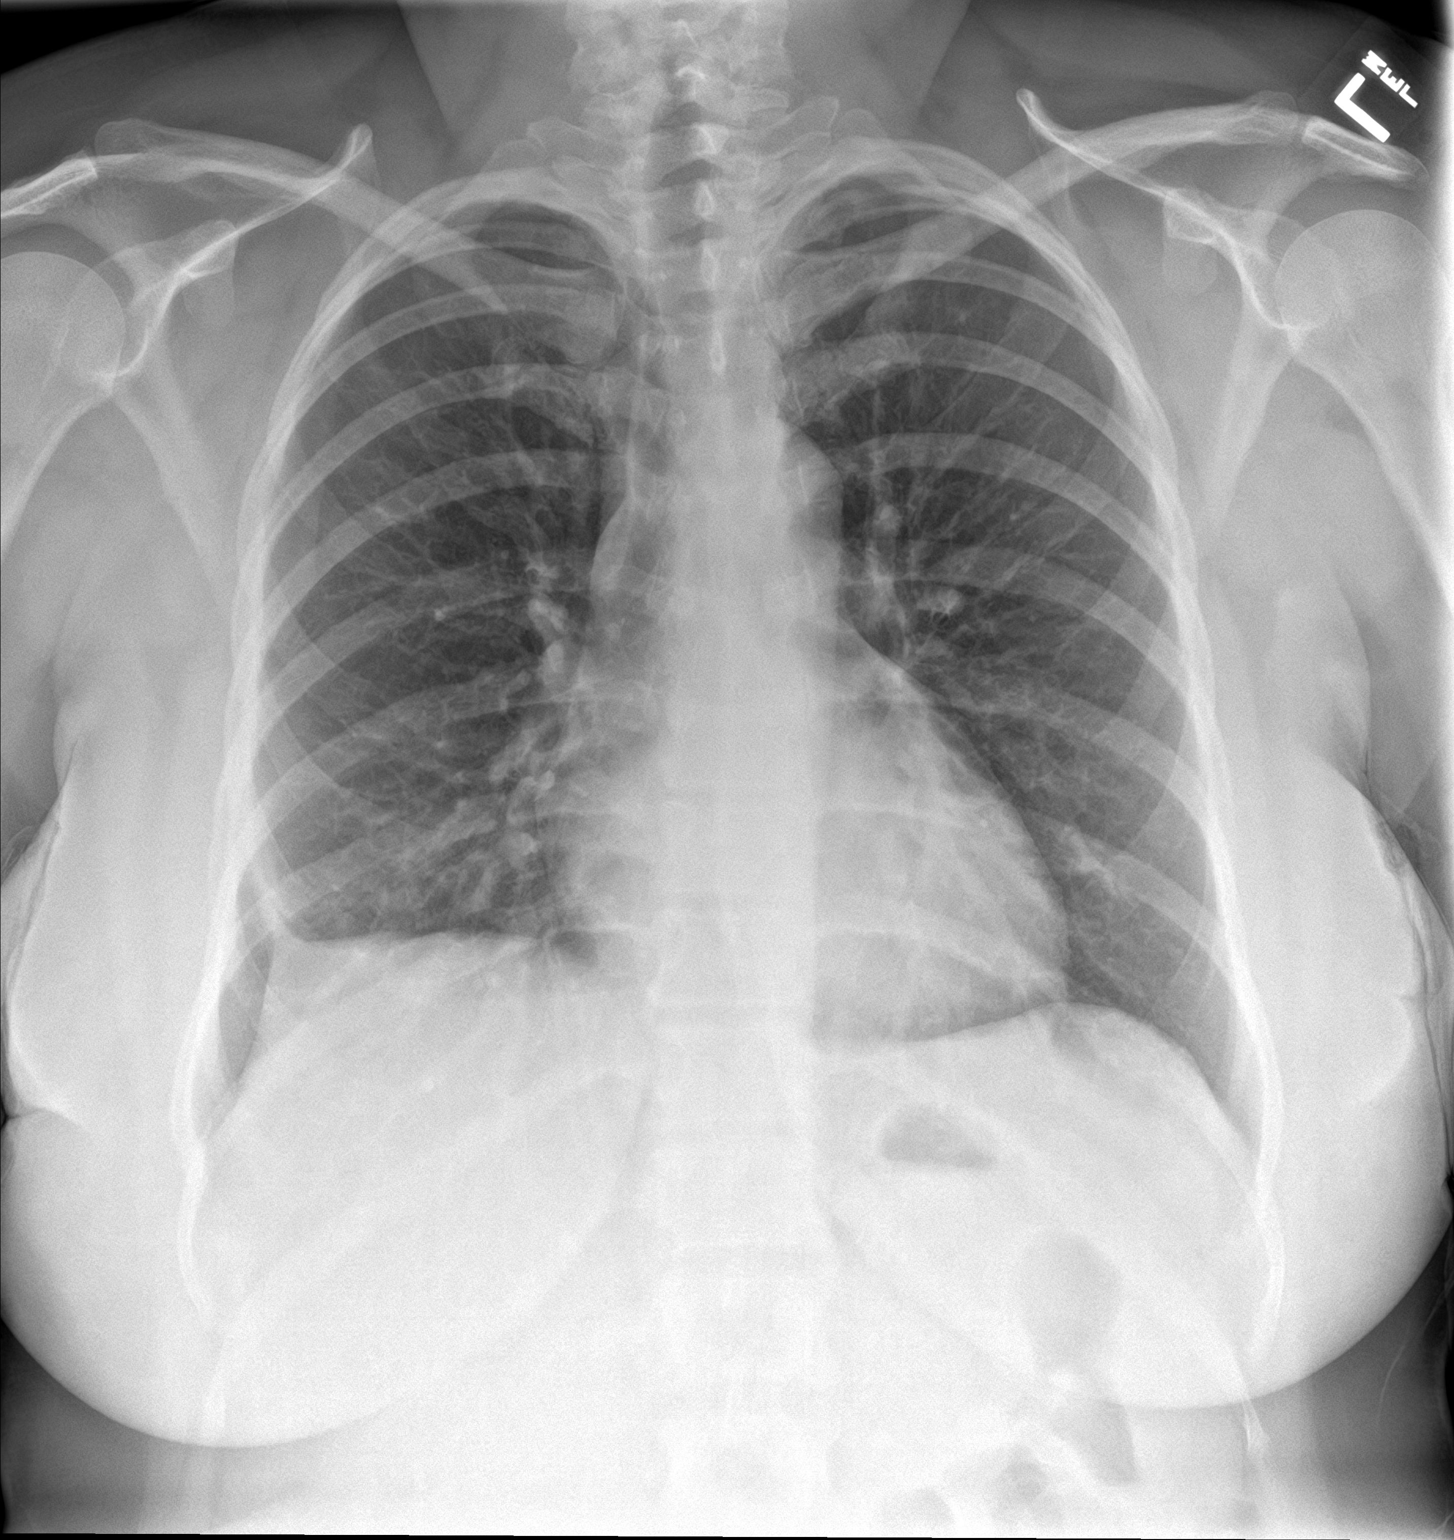

[chest lat]
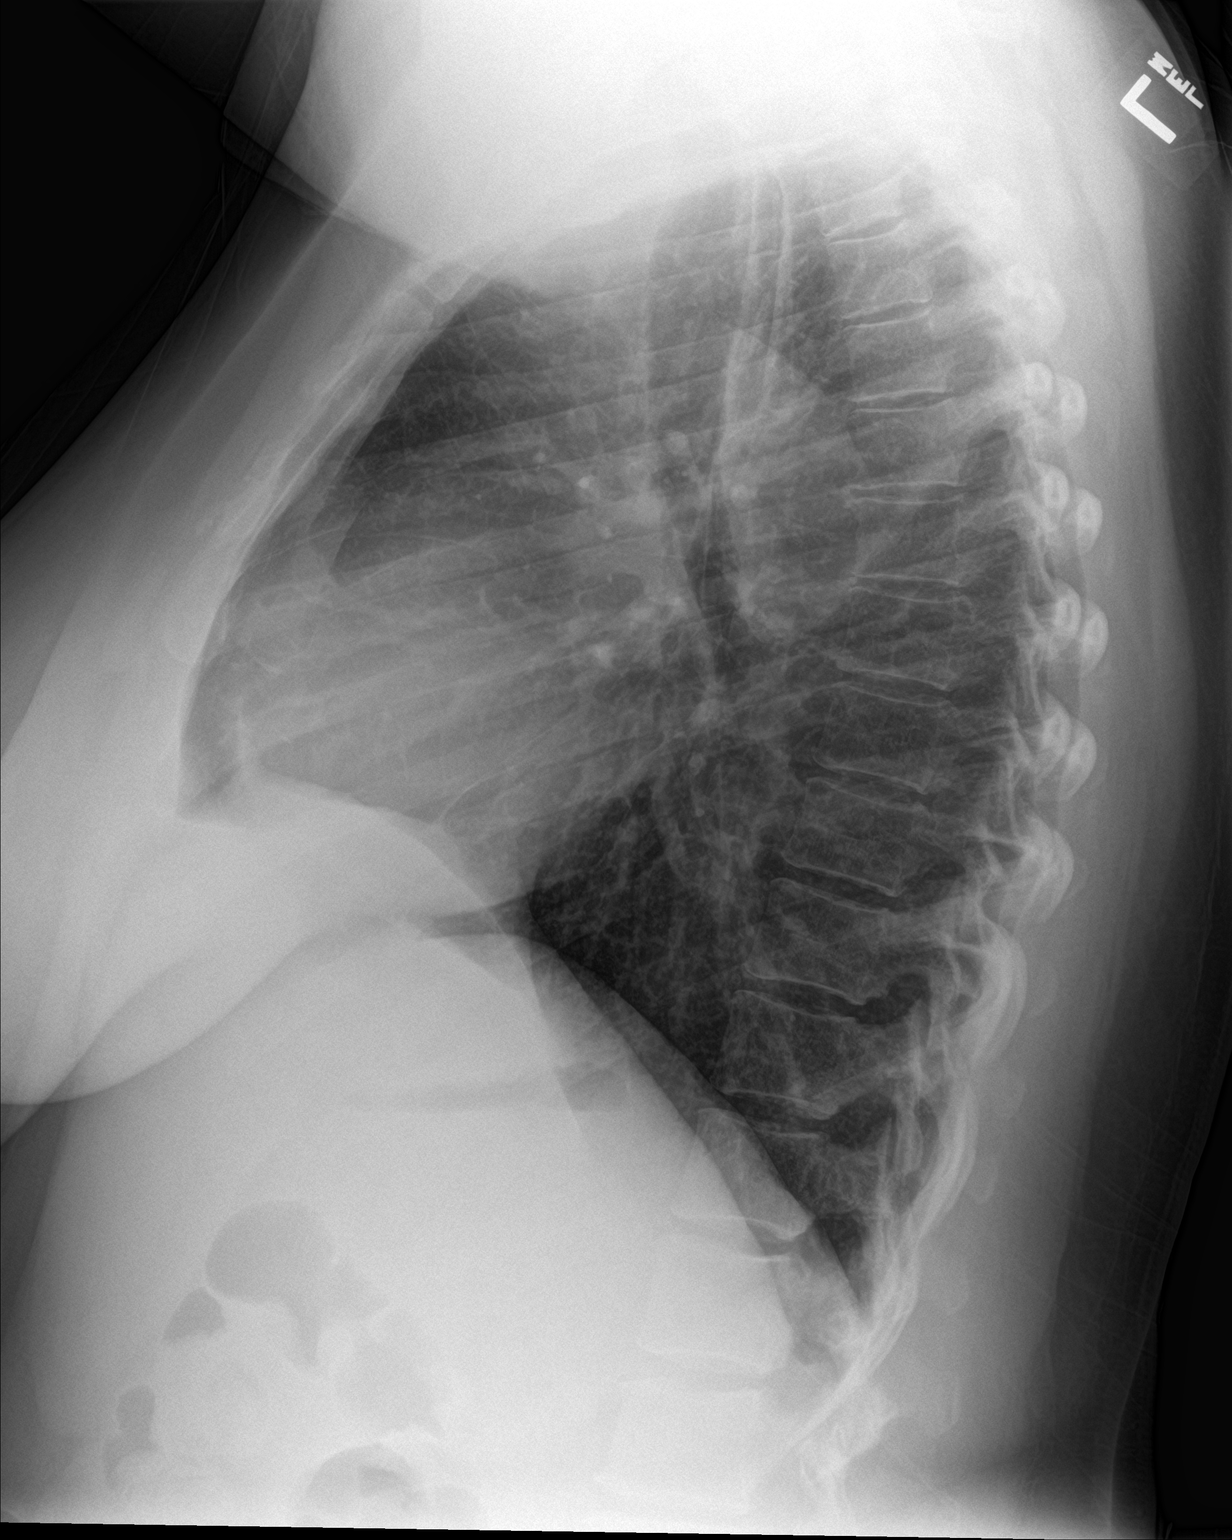

[2 of 2 positions shown; findings below may reference images not displayed]

FINDINGS: Scarring or atelectasis at the right base. No pleural effusion.
Normal heart size. No pneumothorax.
IMPRESSION: No active cardiopulmonary disease. Scarring or atelectasis at the
right base

## 2020-03-20 ENCOUNTER — Ambulatory Visit: Payer: Medicare Other | Admitting: Pulmonary Disease

## 2020-07-25 IMAGING — CR DG CHEST 2V
1 series · 2 of 2 positions shown · non-contrast
Comparison: 05/17/2019

CLINICAL DATA: Shortness of breath

EXAM:
CHEST - 2 VIEW

[Series 1: dg chest 2 view · 0.14mm/px · 2 of 2 slices shown]
[im 1/2]
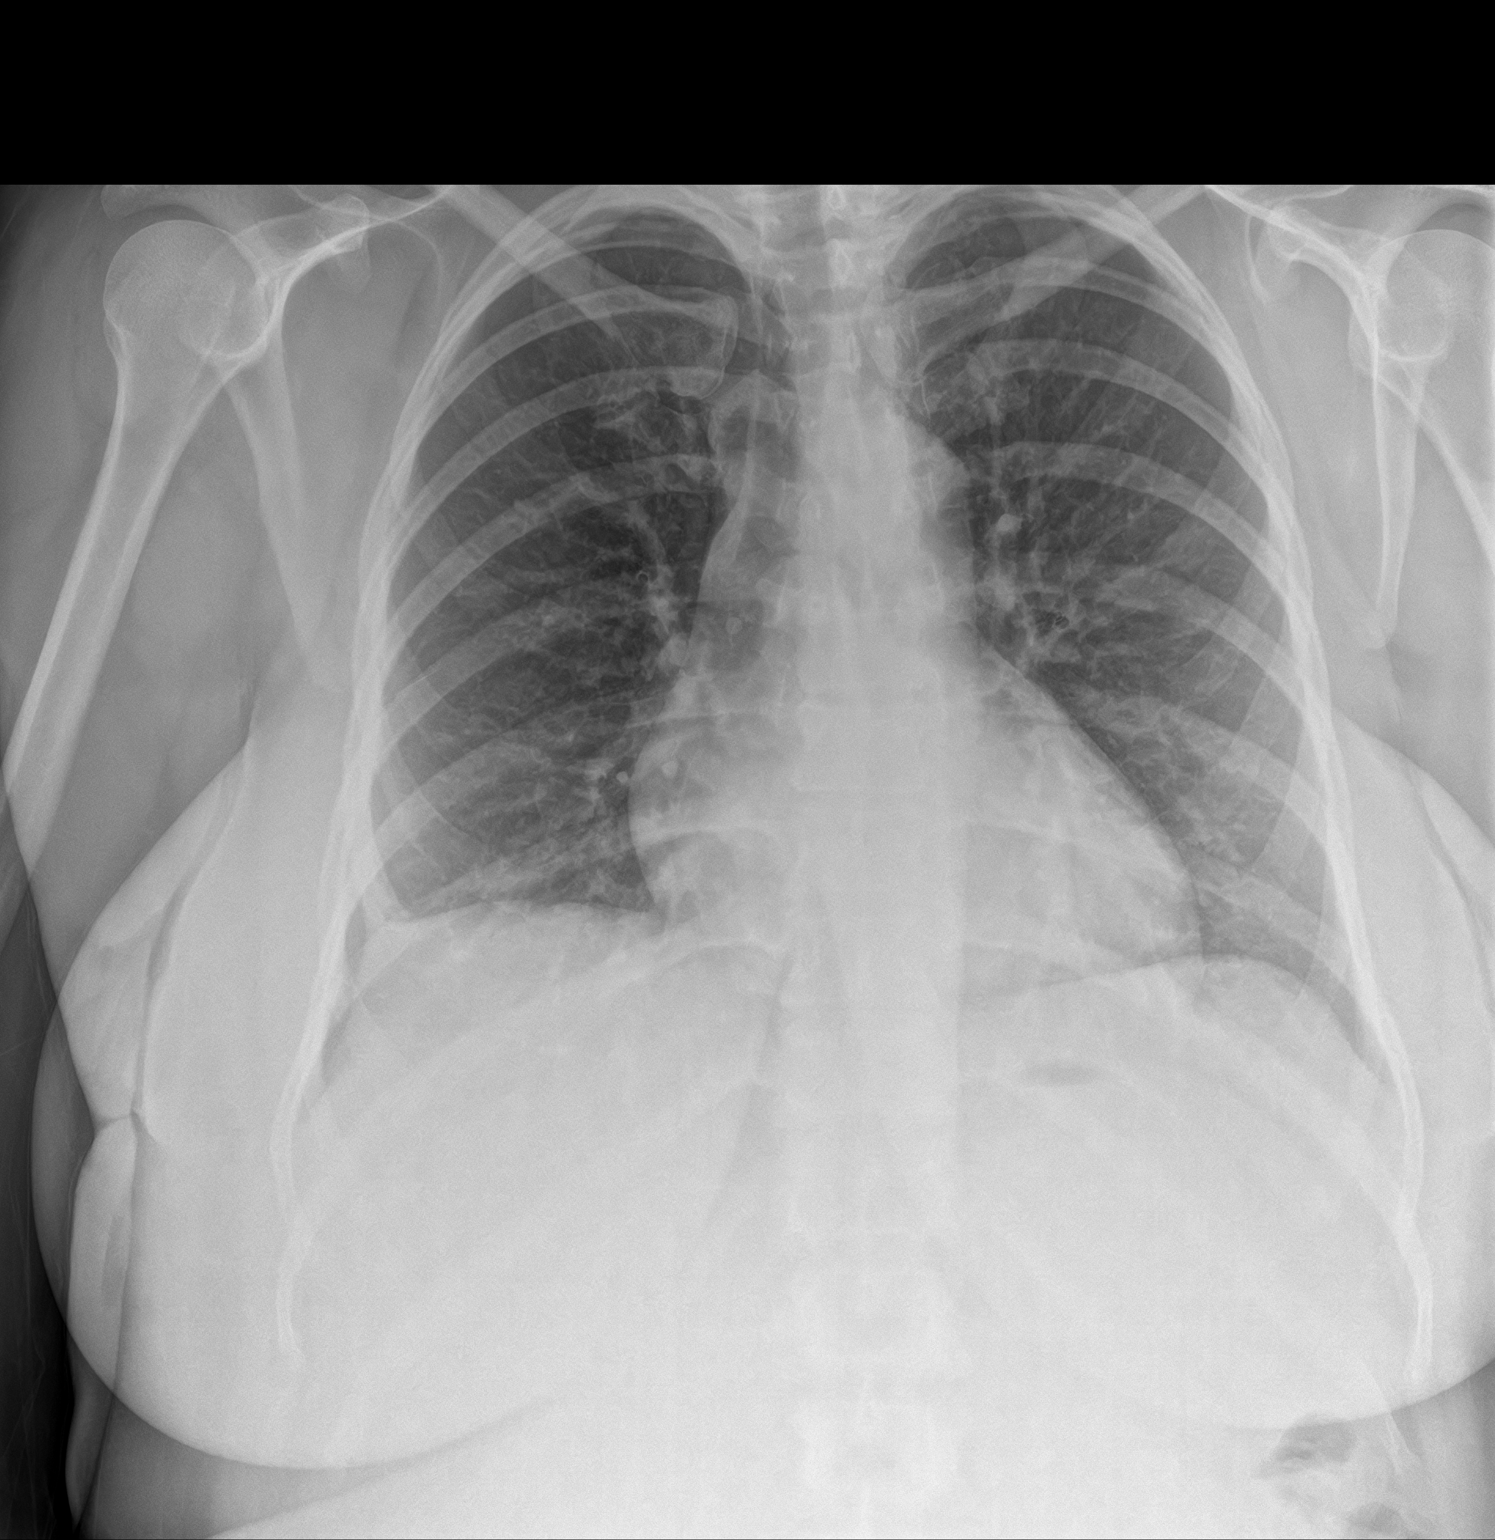
[im 2/2]
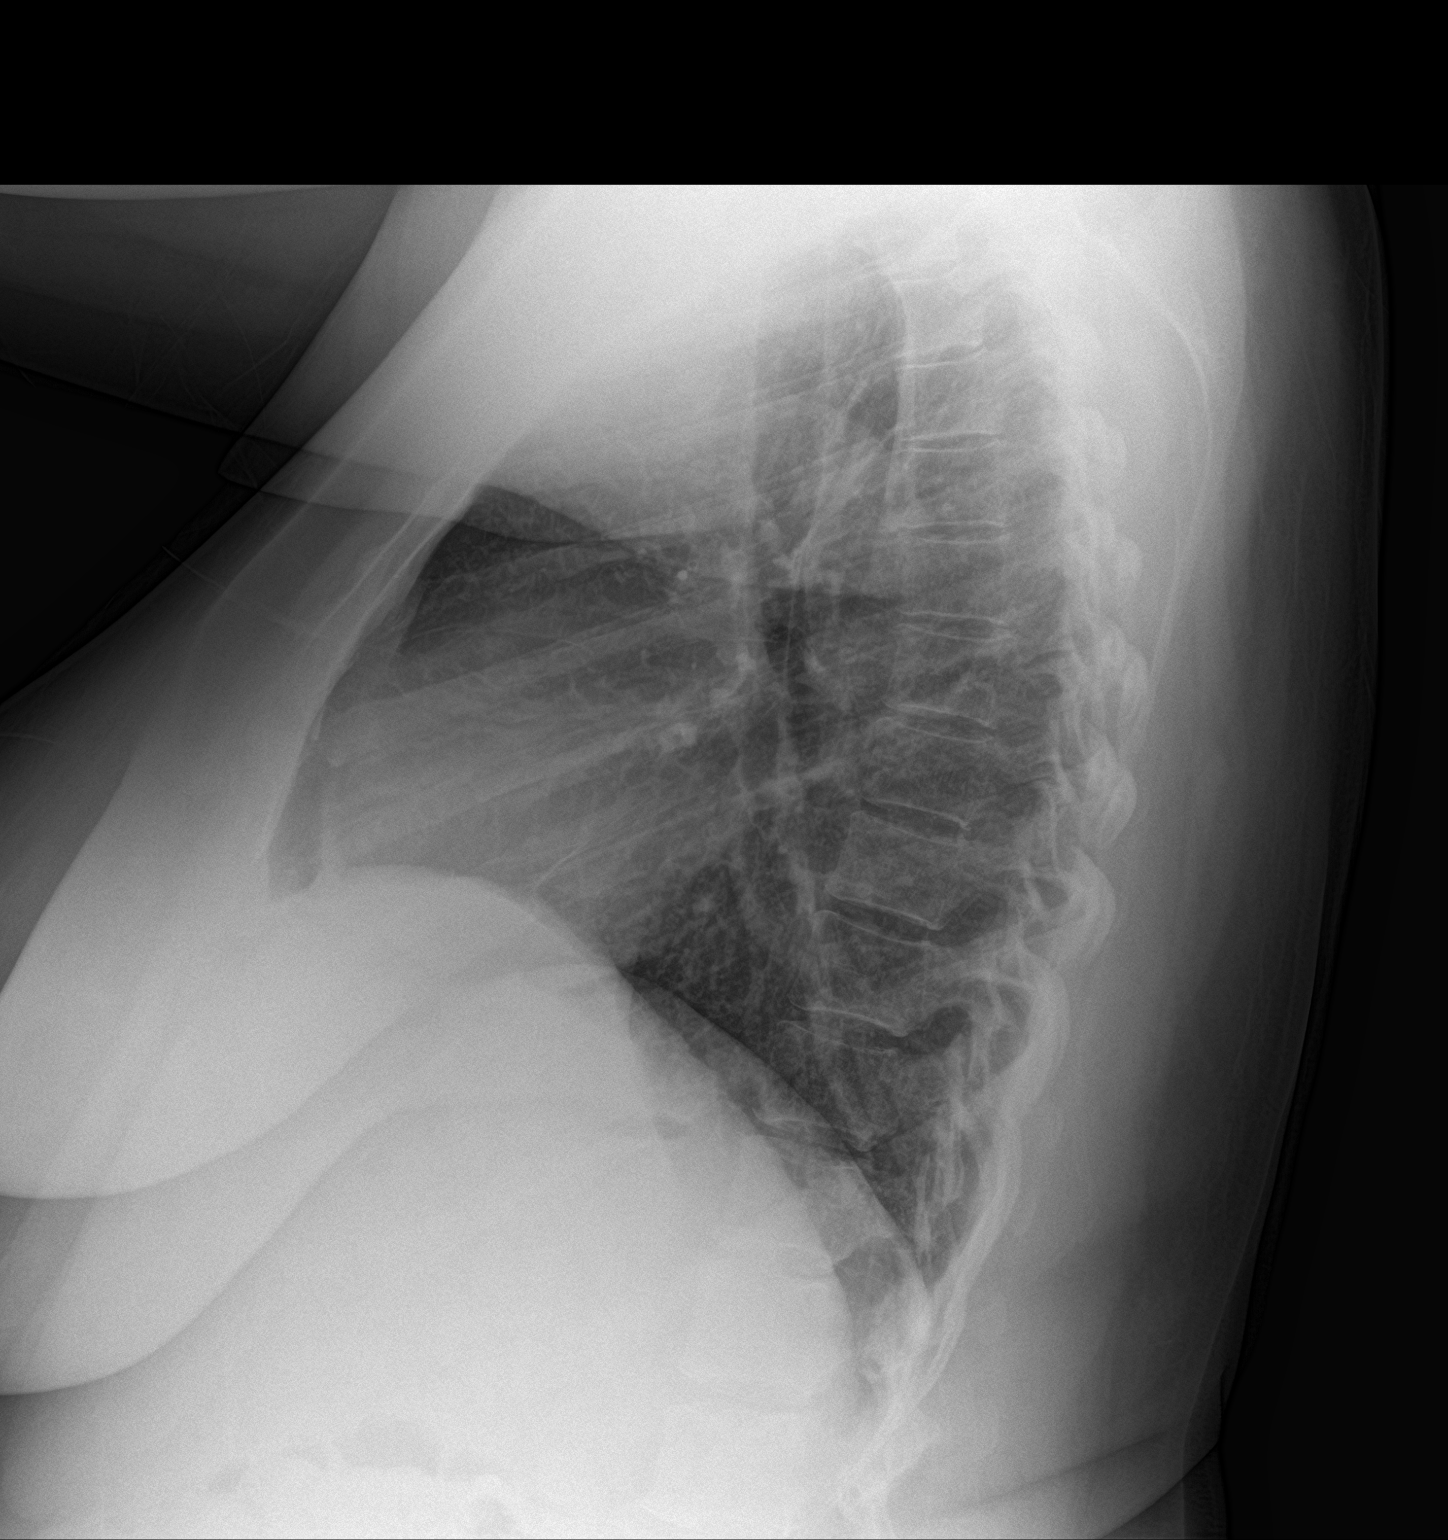

[2 of 2 positions shown; findings below may reference images not displayed]

FINDINGS: Scarring at the right lung base is stable. Left lung clear. Heart is
normal size. No effusions or acute bony abnormality.
IMPRESSION: Right basilar scarring.  No active disease.
